# Patient Record
Sex: Male | Born: 1945 | Race: White | Hispanic: No | Marital: Married | Smoking: Never smoker
Health system: Southern US, Community
[De-identification: ages and names within clinical notes are randomized; demographics above are authoritative.]

## PROBLEM LIST (undated history)

## (undated) DIAGNOSIS — R0989 Other specified symptoms and signs involving the circulatory and respiratory systems: Secondary | ICD-10-CM

## (undated) DIAGNOSIS — E559 Vitamin D deficiency, unspecified: Secondary | ICD-10-CM

## (undated) DIAGNOSIS — E039 Hypothyroidism, unspecified: Secondary | ICD-10-CM

## (undated) DIAGNOSIS — R7303 Prediabetes: Secondary | ICD-10-CM

## (undated) DIAGNOSIS — E785 Hyperlipidemia, unspecified: Secondary | ICD-10-CM

## (undated) HISTORY — DX: Hypothyroidism, unspecified: E03.9

## (undated) HISTORY — PX: OTHER SURGICAL HISTORY: SHX169

## (undated) HISTORY — DX: Other specified symptoms and signs involving the circulatory and respiratory systems: R09.89

## (undated) HISTORY — DX: Hyperlipidemia, unspecified: E78.5

## (undated) HISTORY — PX: LUMBAR DISC SURGERY: SHX700

## (undated) HISTORY — DX: Vitamin D deficiency, unspecified: E55.9

## (undated) HISTORY — DX: Prediabetes: R73.03

---

## 1989-11-21 HISTORY — PX: ORTHOPEDIC SURGERY: SHX850

## 1999-12-27 ENCOUNTER — Ambulatory Visit (HOSPITAL_COMMUNITY): Admission: RE | Admit: 1999-12-27 | Discharge: 1999-12-27 | Payer: Self-pay | Admitting: Internal Medicine

## 1999-12-27 ENCOUNTER — Encounter: Payer: Self-pay | Admitting: Internal Medicine

## 2004-07-23 ENCOUNTER — Encounter: Payer: Self-pay | Admitting: Gastroenterology

## 2004-07-23 DIAGNOSIS — D126 Benign neoplasm of colon, unspecified: Secondary | ICD-10-CM | POA: Insufficient documentation

## 2008-11-21 HISTORY — PX: POLYPECTOMY: SHX149

## 2008-11-21 HISTORY — PX: COLONOSCOPY: SHX174

## 2008-12-04 ENCOUNTER — Encounter: Payer: Self-pay | Admitting: Gastroenterology

## 2008-12-29 ENCOUNTER — Ambulatory Visit: Payer: Self-pay | Admitting: Gastroenterology

## 2008-12-29 LAB — CONVERTED CEMR LAB
Basophils Absolute: 0 10*3/uL (ref 0.0–0.1)
Basophils Relative: 0.3 % (ref 0.0–3.0)
Eosinophils Absolute: 0.1 10*3/uL (ref 0.0–0.7)
Eosinophils Relative: 1.1 % (ref 0.0–5.0)
HCT: 39.8 % (ref 39.0–52.0)
Hemoglobin: 13.5 g/dL (ref 13.0–17.0)
Lymphocytes Relative: 23.7 % (ref 12.0–46.0)
MCHC: 34.1 g/dL (ref 30.0–36.0)
MCV: 88.1 fL (ref 78.0–100.0)
Monocytes Absolute: 0.7 10*3/uL (ref 0.1–1.0)
Monocytes Relative: 10.5 % (ref 3.0–12.0)
Neutro Abs: 4.5 10*3/uL (ref 1.4–7.7)
Neutrophils Relative %: 64.4 % (ref 43.0–77.0)
Platelets: 215 10*3/uL (ref 150–400)
RBC: 4.51 M/uL (ref 4.22–5.81)
RDW: 12.6 % (ref 11.5–14.6)
WBC: 7 10*3/uL (ref 4.5–10.5)

## 2009-01-01 ENCOUNTER — Encounter: Payer: Self-pay | Admitting: Gastroenterology

## 2009-01-28 ENCOUNTER — Ambulatory Visit: Payer: Self-pay | Admitting: Gastroenterology

## 2009-02-06 ENCOUNTER — Ambulatory Visit: Payer: Self-pay | Admitting: Gastroenterology

## 2009-02-06 ENCOUNTER — Encounter: Payer: Self-pay | Admitting: Gastroenterology

## 2009-02-10 ENCOUNTER — Encounter: Payer: Self-pay | Admitting: Gastroenterology

## 2009-12-04 ENCOUNTER — Encounter: Payer: Self-pay | Admitting: Gastroenterology

## 2010-04-21 ENCOUNTER — Ambulatory Visit (HOSPITAL_COMMUNITY): Admission: RE | Admit: 2010-04-21 | Discharge: 2010-04-21 | Payer: Self-pay | Admitting: Surgery

## 2011-02-07 LAB — CBC
HCT: 41 % (ref 39.0–52.0)
Hemoglobin: 13.5 g/dL (ref 13.0–17.0)
MCHC: 32.9 g/dL (ref 30.0–36.0)
MCV: 88.2 fL (ref 78.0–100.0)
Platelets: 228 10*3/uL (ref 150–400)
RBC: 4.66 MIL/uL (ref 4.22–5.81)
RDW: 14.4 % (ref 11.5–15.5)
WBC: 6.1 10*3/uL (ref 4.0–10.5)

## 2011-02-07 LAB — BASIC METABOLIC PANEL
BUN: 20 mg/dL (ref 6–23)
CO2: 28 mEq/L (ref 19–32)
Calcium: 9.7 mg/dL (ref 8.4–10.5)
Chloride: 105 mEq/L (ref 96–112)
Creatinine, Ser: 1.62 mg/dL — ABNORMAL HIGH (ref 0.4–1.5)
GFR calc Af Amer: 52 mL/min — ABNORMAL LOW (ref >60)
GFR calc non Af Amer: 43 mL/min — ABNORMAL LOW (ref >60)
Glucose, Bld: 98 mg/dL (ref 70–99)
Potassium: 4.3 mEq/L (ref 3.5–5.1)
Sodium: 141 mEq/L (ref 135–145)

## 2011-02-07 LAB — DIFFERENTIAL
Basophils Absolute: 0 10*3/uL (ref 0.0–0.1)
Basophils Relative: 1 % (ref 0–1)
Neutro Abs: 3.8 10*3/uL (ref 1.7–7.7)
Neutrophils Relative %: 62 % (ref 43–77)

## 2011-03-28 ENCOUNTER — Ambulatory Visit (HOSPITAL_COMMUNITY): Payer: 59

## 2011-03-28 ENCOUNTER — Ambulatory Visit (HOSPITAL_COMMUNITY)
Admission: RE | Admit: 2011-03-28 | Discharge: 2011-03-28 | Disposition: A | Discharge status: Home / Self Care | Payer: 59 | Source: Ambulatory Visit | Attending: Urology | Admitting: Urology

## 2011-03-28 DIAGNOSIS — N200 Calculus of kidney: Secondary | ICD-10-CM | POA: Insufficient documentation

## 2011-03-28 DIAGNOSIS — R310 Gross hematuria: Secondary | ICD-10-CM | POA: Insufficient documentation

## 2011-09-25 ENCOUNTER — Other Ambulatory Visit (INDEPENDENT_AMBULATORY_CARE_PROVIDER_SITE_OTHER): Payer: Self-pay | Admitting: Surgery

## 2013-11-06 ENCOUNTER — Encounter: Payer: Self-pay | Admitting: Internal Medicine

## 2013-11-06 DIAGNOSIS — E039 Hypothyroidism, unspecified: Secondary | ICD-10-CM | POA: Insufficient documentation

## 2013-11-06 DIAGNOSIS — R0989 Other specified symptoms and signs involving the circulatory and respiratory systems: Secondary | ICD-10-CM | POA: Insufficient documentation

## 2013-11-06 DIAGNOSIS — R7303 Prediabetes: Secondary | ICD-10-CM | POA: Insufficient documentation

## 2013-11-06 DIAGNOSIS — E785 Hyperlipidemia, unspecified: Secondary | ICD-10-CM | POA: Insufficient documentation

## 2013-11-07 ENCOUNTER — Ambulatory Visit (INDEPENDENT_AMBULATORY_CARE_PROVIDER_SITE_OTHER): Payer: 59 | Admitting: Emergency Medicine

## 2013-11-07 VITALS — BP 122/74 | HR 60 | Temp 98.1°F | Resp 16 | Wt 185.2 lb

## 2013-11-07 DIAGNOSIS — Z23 Encounter for immunization: Secondary | ICD-10-CM

## 2013-11-07 DIAGNOSIS — I10 Essential (primary) hypertension: Secondary | ICD-10-CM

## 2013-11-07 DIAGNOSIS — R5381 Other malaise: Secondary | ICD-10-CM

## 2013-11-07 DIAGNOSIS — E782 Mixed hyperlipidemia: Secondary | ICD-10-CM

## 2013-11-07 DIAGNOSIS — M549 Dorsalgia, unspecified: Secondary | ICD-10-CM

## 2013-11-07 NOTE — Patient Instructions (Signed)
Back Pain, Adult  Back pain is very common. The pain often gets better over time. The cause of back pain is usually not dangerous. Most people can learn to manage their back pain on their own.   HOME CARE   · Stay active. Start with short walks on flat ground if you can. Try to walk farther each day.  · Do not sit, drive, or stand in one place for more than 30 minutes. Do not stay in bed.  · Do not avoid exercise or work. Activity can help your back heal faster.  · Be careful when you bend or lift an object. Bend at your knees, keep the object close to you, and do not twist.  · Sleep on a firm mattress. Lie on your side, and bend your knees. If you lie on your back, put a pillow under your knees.  · Only take medicines as told by your doctor.  · Put ice on the injured area.  · Put ice in a plastic bag.  · Place a towel between your skin and the bag.  · Leave the ice on for 15-20 minutes, 03-04 times a day for the first 2 to 3 days. After that, you can switch between ice and heat packs.  · Ask your doctor about back exercises or massage.  · Avoid feeling anxious or stressed. Find good ways to deal with stress, such as exercise.  GET HELP RIGHT AWAY IF:   · Your pain does not go away with rest or medicine.  · Your pain does not go away in 1 week.  · You have new problems.  · You do not feel well.  · The pain spreads into your legs.  · You cannot control when you poop (bowel movement) or pee (urinate).  · Your arms or legs feel weak or lose feeling (numbness).  · You feel sick to your stomach (nauseous) or throw up (vomit).  · You have belly (abdominal) pain.  · You feel like you may pass out (faint).  MAKE SURE YOU:   · Understand these instructions.  · Will watch your condition.  · Will get help right away if you are not doing well or get worse.  Document Released: 04/25/2008 Document Revised: 01/30/2012 Document Reviewed: 03/28/2011  ExitCare® Patient Information ©2014 [employer].

## 2013-11-10 ENCOUNTER — Encounter: Payer: Self-pay | Admitting: Emergency Medicine

## 2013-11-10 NOTE — Progress Notes (Signed)
   Subjective:    Patient ID: Tyler Gilbert, male    DOB: 04/02/1946, 67 y.o.   MRN: [id number]  HPI Comments: 67 yo male presents for f/u with back pain. He notes he has decreased lifting and modified exercise routine without any change with pain. He notes pain is on/ off and denies any triggers. He notes the pain is midline but occasional radiates to the lateral sides.   Current Outpatient Prescriptions on File Prior to Visit  Medication Sig Dispense Refill  . aspirin 325 MG tablet Take 325 mg by mouth every other day. Taking 1/2      . Cholecalciferol (VITAMIN D PO) Take 6,000 Int'l Units by mouth daily.      . fenofibrate micronized (LOFIBRA) 134 MG capsule Take 134 mg by mouth daily before breakfast.      . levothyroxine (SYNTHROID, LEVOTHROID) 100 MCG tablet Take 100 mcg by mouth daily before breakfast. 1/2 tablet      . MAGNESIUM PO Take 1,000 mg by mouth daily.      . Multiple Vitamins-Minerals (MULTIVITAMIN PO) Take by mouth daily.      . Omega-3 Fatty Acids (FISH OIL PO) Take by mouth daily.      . rosuvastatin (CRESTOR) 20 MG tablet Take 20 mg by mouth daily.       No current facility-administered medications on file prior to visit.   ALLERGIES Prednisolone  Past Medical History  Diagnosis Date  . Labile hypertension   . Hyperlipidemia   . Prediabetes   . Hypothyroid   . Vitamin D deficiency     Review of Systems  Musculoskeletal: Positive for back pain.  All other systems reviewed and are negative.   BP 122/74  Pulse 60  Temp(Src) 98.1 F (36.7 C)  Resp 16  Wt 185 lb 3.2 oz (84.006 kg)     Objective:   Physical Exam  Nursing note and vitals reviewed. Constitutional: He is oriented to person, place, and time. He appears well-developed and well-nourished.  Cardiovascular: Normal rate, regular rhythm, normal heart sounds and intact distal pulses.   Pulmonary/Chest: Effort normal and breath sounds normal.  Musculoskeletal: Normal range of motion. He  exhibits no tenderness.  No obvious tenderness  Neurological: He is alert and oriented to person, place, and time. He has normal reflexes. No cranial nerve deficit. Coordination normal.  Skin: Skin is warm and dry.  Psychiatric: He has a normal mood and affect. Judgment normal.          Assessment & Plan:  Back pain- get XrAy evaluation, heat stretch, ice, explained.

## 2013-11-12 ENCOUNTER — Ambulatory Visit (INDEPENDENT_AMBULATORY_CARE_PROVIDER_SITE_OTHER): Payer: 59 | Admitting: Emergency Medicine

## 2013-11-12 ENCOUNTER — Encounter: Payer: Self-pay | Admitting: Emergency Medicine

## 2013-11-12 VITALS — BP 118/62 | HR 58 | Temp 98.2°F | Resp 18 | Wt 180.0 lb

## 2013-11-12 DIAGNOSIS — J329 Chronic sinusitis, unspecified: Secondary | ICD-10-CM

## 2013-11-12 DIAGNOSIS — R05 Cough: Secondary | ICD-10-CM

## 2013-11-12 DIAGNOSIS — J309 Allergic rhinitis, unspecified: Secondary | ICD-10-CM

## 2013-11-12 MED ORDER — AZITHROMYCIN 250 MG PO TABS: ORAL_TABLET | ORAL | Status: AC

## 2013-11-12 MED ORDER — PREDNISONE 10 MG PO TABS: ORAL_TABLET | ORAL | Status: DC

## 2013-11-12 MED ORDER — BENZONATATE 100 MG PO CAPS: 100.0000 mg | ORAL_CAPSULE | Freq: Three times a day (TID) | ORAL | Status: DC | PRN

## 2013-11-12 NOTE — Progress Notes (Signed)
   Subjective:    Patient ID: Tyler Gilbert, male    DOB: 1946/04/24, 67 y.o.   MRN: [id number]  HPI Comments: 67 yo male presents with no relief with cold symptoms with NYquil x 2days. 3 days of increasing itchy throat, sinus pressure, dry cough and now feels more feverish and that symptoms are progressing to chest.   Sinusitis Associated symptoms include congestion, coughing, sinus pressure, sneezing and a sore throat.   Current Outpatient Prescriptions on File Prior to Visit  Medication Sig Dispense Refill  . aspirin 325 MG tablet Take 325 mg by mouth every other day. Taking 1/2      . Cholecalciferol (VITAMIN D PO) Take 6,000 Int'l Units by mouth daily.      . fenofibrate micronized (LOFIBRA) 134 MG capsule Take 134 mg by mouth daily before breakfast.      . levothyroxine (SYNTHROID, LEVOTHROID) 100 MCG tablet Take 100 mcg by mouth daily before breakfast. 1/2 tablet      . MAGNESIUM PO Take 1,000 mg by mouth daily.      . Multiple Vitamins-Minerals (MULTIVITAMIN PO) Take by mouth daily.      . Omega-3 Fatty Acids (FISH OIL PO) Take by mouth daily.      . rosuvastatin (CRESTOR) 20 MG tablet Take 20 mg by mouth daily.       No current facility-administered medications on file prior to visit.   ALLERGIES Prednisolone  Past Medical History  Diagnosis Date  . Labile hypertension   . Hyperlipidemia   . Prediabetes   . Hypothyroid   . Vitamin D deficiency       Review of Systems  Constitutional: Positive for fever and fatigue.  HENT: Positive for congestion, postnasal drip, sinus pressure, sneezing and sore throat.   Respiratory: Positive for cough.   Gastrointestinal: Positive for abdominal pain.   BP 118/62  Pulse 58  Temp(Src) 98.2 F (36.8 C) (Temporal)  Resp 18  Wt 180 lb (81.647 kg)     Objective:   Physical Exam  Nursing note and vitals reviewed. Constitutional: He is oriented to person, place, and time. He appears well-developed and well-nourished.   HENT:  Head: Normocephalic and atraumatic.  Right Ear: External ear normal.  Left Ear: External ear normal.  Nose: Nose normal.  Mouth/Throat: Oropharynx is clear and moist. No oropharyngeal exudate.  TMs yellow bilateral with frontal/ maxillary tenderness  Eyes: Conjunctivae are normal.  Neck: Normal range of motion.  Cardiovascular: Normal rate, regular rhythm, normal heart sounds and intact distal pulses.   Pulmonary/Chest: Effort normal and breath sounds normal.  Dry hacky cough  Abdominal: Soft.  Musculoskeletal: Normal range of motion.  Lymphadenopathy:    He has no cervical adenopathy.  Neurological: He is alert and oriented to person, place, and time.  Skin: Skin is warm and dry.  Psychiatric: He has a normal mood and affect. Judgment normal.          Assessment & Plan:  Sinusitis/Cough/  Allergic rhinitis- Allegra OTC, increase H2o, allergy hygiene explained. ZPak/ Pred 10 mg DP, Tessalon Perles 100mg  all AD.

## 2013-11-12 NOTE — Patient Instructions (Signed)
Sinusitis Sinusitis is redness, soreness, and puffiness (inflammation) of the air pockets in the bones of your face (sinuses). The redness, soreness, and puffiness can cause air and mucus to get trapped in your sinuses. This can allow germs to grow and cause an infection.  HOME CARE   Drink enough fluids to keep your pee (urine) clear or pale yellow.  Use a humidifier in your home.  Run a hot shower to create steam in the bathroom. Sit in the bathroom with the door closed. Breathe in the steam 3 4 times a day.  Put a warm, moist washcloth on your face 3 4 times a day, or as told by your doctor.  Use salt water sprays (saline sprays) to wet the thick fluid in your nose. This can help the sinuses drain.  Only take medicine as told by your doctor. GET HELP RIGHT AWAY IF:   Your pain gets worse.  You have very bad headaches.  You are sick to your stomach (nauseous).  You throw up (vomit).  You are very sleepy (drowsy) all the time.  Your face is puffy (swollen).  Your vision changes.  You have a stiff neck.  You have trouble breathing. MAKE SURE YOU:   Understand these instructions.  Will watch your condition.  Will get help right away if you are not doing well or get worse. Document Released: 04/25/2008 Document Revised: 08/01/2012 Document Reviewed: 06/12/2012 Exit[employer] Patient Information 2014 [employer]. Allergic Rhinitis Allergic rhinitis is when the mucous membranes in the nose respond to allergens. Allergens are particles in the air that cause your body to have an allergic reaction. This causes you to release allergic antibodies. Through a chain of events, these eventually cause you to release histamine into the blood stream (hence the use of antihistamines). Although meant to be protective to the body, it is this release that causes your discomfort, such as frequent sneezing, congestion and an itchy runny nose.  CAUSES  The pollen allergens may come from grasses,  trees, and weeds. This is seasonal allergic rhinitis, or "hay fever." Other allergens cause year-round allergic rhinitis (perennial allergic rhinitis) such as house dust mite allergen, pet dander and mold spores.  SYMPTOMS   Nasal stuffiness (congestion).  Runny, itchy nose with sneezing and tearing of the eyes.  There is often an itching of the mouth, eyes and ears. It cannot be cured, but it can be controlled with medications. DIAGNOSIS  If you are unable to determine the offending allergen, skin or blood testing may find it. TREATMENT   Avoid the allergen.  Medications and allergy shots (immunotherapy) can help.  Hay fever may often be treated with antihistamines in pill or nasal spray forms. Antihistamines block the effects of histamine. There are over-the-counter medicines that may help with nasal congestion and swelling around the eyes. Check with your caregiver before taking or giving this medicine. If the treatment above does not work, there are many new medications your caregiver can prescribe. Stronger medications may be used if initial measures are ineffective. Desensitizing injections can be used if medications and avoidance fails. Desensitization is when a patient is given ongoing shots until the body becomes less sensitive to the allergen. Make sure you follow up with your caregiver if problems continue. SEEK MEDICAL CARE IF:   You develop fever (more than 100.5 F (38.1 C).  You develop a cough that does not stop easily (persistent).  You have shortness of breath.  You start wheezing.  Symptoms interfere with   normal daily activities. Document Released: 08/02/2001 Document Revised: 01/30/2012 Document Reviewed: 02/11/2009 Exit[employer] Patient Information 2014 [employer].  

## 2013-11-19 ENCOUNTER — Ambulatory Visit
Admission: RE | Admit: 2013-11-19 | Discharge: 2013-11-19 | Disposition: A | Discharge status: Home / Self Care | Payer: 59 | Source: Ambulatory Visit | Attending: Emergency Medicine | Admitting: Emergency Medicine

## 2013-11-19 DIAGNOSIS — M549 Dorsalgia, unspecified: Secondary | ICD-10-CM

## 2013-11-22 ENCOUNTER — Telehealth: Payer: Self-pay | Admitting: *Deleted

## 2013-11-22 DIAGNOSIS — M549 Dorsalgia, unspecified: Secondary | ICD-10-CM

## 2013-11-22 NOTE — Telephone Encounter (Signed)
Spoke with patient's wife, Butch Penny, about Xray results and she is requesting Ortho referral.  Referral sent to Dr. Berenice Primas' office for further evaluation and treatment.

## 2013-12-17 ENCOUNTER — Encounter: Payer: Self-pay | Admitting: Gastroenterology

## 2014-01-31 ENCOUNTER — Other Ambulatory Visit: Payer: Self-pay | Admitting: Physician Assistant

## 2014-01-31 MED ORDER — FENOFIBRATE 160 MG PO TABS: 160.0000 mg | ORAL_TABLET | Freq: Every day | ORAL | Status: DC

## 2014-02-06 ENCOUNTER — Ambulatory Visit (INDEPENDENT_AMBULATORY_CARE_PROVIDER_SITE_OTHER): Payer: 59 | Admitting: Internal Medicine

## 2014-02-06 ENCOUNTER — Encounter: Payer: Self-pay | Admitting: Internal Medicine

## 2014-02-06 ENCOUNTER — Ambulatory Visit: Payer: Self-pay | Admitting: Emergency Medicine

## 2014-02-06 VITALS — BP 118/78 | HR 56 | Temp 98.6°F | Resp 16 | Ht 71.5 in | Wt 182.4 lb

## 2014-02-06 DIAGNOSIS — I10 Essential (primary) hypertension: Secondary | ICD-10-CM

## 2014-02-06 DIAGNOSIS — R0989 Other specified symptoms and signs involving the circulatory and respiratory systems: Secondary | ICD-10-CM

## 2014-02-06 DIAGNOSIS — Z125 Encounter for screening for malignant neoplasm of prostate: Secondary | ICD-10-CM

## 2014-02-06 DIAGNOSIS — Z79899 Other long term (current) drug therapy: Secondary | ICD-10-CM | POA: Insufficient documentation

## 2014-02-06 DIAGNOSIS — Z789 Other specified health status: Secondary | ICD-10-CM

## 2014-02-06 DIAGNOSIS — E782 Mixed hyperlipidemia: Secondary | ICD-10-CM

## 2014-02-06 DIAGNOSIS — E559 Vitamin D deficiency, unspecified: Secondary | ICD-10-CM | POA: Insufficient documentation

## 2014-02-06 DIAGNOSIS — Z111 Encounter for screening for respiratory tuberculosis: Secondary | ICD-10-CM

## 2014-02-06 DIAGNOSIS — Z1212 Encounter for screening for malignant neoplasm of rectum: Secondary | ICD-10-CM

## 2014-02-06 DIAGNOSIS — Z1331 Encounter for screening for depression: Secondary | ICD-10-CM

## 2014-02-06 DIAGNOSIS — Z0000 Encounter for general adult medical examination without abnormal findings: Secondary | ICD-10-CM

## 2014-02-06 LAB — CBC WITH DIFFERENTIAL/PLATELET
BASOS ABS: 0.1 10*3/uL (ref 0.0–0.1)
BASOS PCT: 1 % (ref 0–1)
Eosinophils Absolute: 0.1 10*3/uL (ref 0.0–0.7)
Eosinophils Relative: 2 % (ref 0–5)
HEMATOCRIT: 41.8 % (ref 39.0–52.0)
HEMOGLOBIN: 14.4 g/dL (ref 13.0–17.0)
LYMPHS PCT: 24 % (ref 12–46)
Lymphs Abs: 1.5 10*3/uL (ref 0.7–4.0)
MCH: 29.9 pg (ref 26.0–34.0)
MCHC: 34.4 g/dL (ref 30.0–36.0)
MCV: 86.7 fL (ref 78.0–100.0)
MONOS PCT: 9 % (ref 3–12)
Monocytes Absolute: 0.6 10*3/uL (ref 0.1–1.0)
NEUTROS ABS: 4.1 10*3/uL (ref 1.7–7.7)
NEUTROS PCT: 64 % (ref 43–77)
Platelets: 254 10*3/uL (ref 150–400)
RBC: 4.82 MIL/uL (ref 4.22–5.81)
RDW: 13.6 % (ref 11.5–15.5)
WBC: 6.4 10*3/uL (ref 4.0–10.5)

## 2014-02-06 LAB — HEMOGLOBIN A1C
Hgb A1c MFr Bld: 5.9 % — ABNORMAL HIGH (ref <5.7)
MEAN PLASMA GLUCOSE: 123 mg/dL — AB (ref <117)

## 2014-02-06 MED ORDER — PREDNISONE 10 MG PO TABS: ORAL_TABLET | ORAL | Status: DC

## 2014-02-06 MED ORDER — CYCLOBENZAPRINE HCL 10 MG PO TABS: ORAL_TABLET | ORAL | Status: DC

## 2014-02-06 MED ORDER — FENOFIBRATE 160 MG PO TABS: 160.0000 mg | ORAL_TABLET | Freq: Every day | ORAL | Status: DC

## 2014-02-06 NOTE — Patient Instructions (Signed)

## 2014-02-06 NOTE — Progress Notes (Signed)
Patient ID: Tyler Gilbert, male   DOB: 04/07/1946, 68 y.o.   MRN: [id number]   Annual Screening Comprehensive Examination  This very nice 68 y.o.  MWM presents for complete physical.  Patient has been followed for Labile HTN, Diabetes  Prediabetes, Hyperlipidemia, and Vitamin D Deficiency.   Labile HTN predates since 2008 and has been monitored expectantly. Patient's BP has been controlled at home.Today's BP: 118/78 mmHg. Patient denies any cardiac symptoms as chest pain, palpitations, shortness of breath, dizziness or ankle swelling.   Patient's hyperlipidemia is controlled with diet and meds. Patient denies myalgias or other medication SE's. Last cholesterol last visit was 167, triglycerides 154, HDL 38 and LDL 98 in Oct 2014 - at goal.     Patient has prediabetes with A1c 5.8% in 2011 and with last A1c 6.1 % in Oct 2014. Patient denies reactive hypoglycemic symptoms, visual blurring, diabetic polys, or paresthesias.    Finally, patient has history of Vitamin D Deficiency of 30 in 2008 with last vitamin D 75 in Oct 2014.  Medication Sig  . aspirin 325 MG tablet Take 325 mg by mouth every other day. Taking 1/2  . Cholecalciferol (VITAMIN D PO) Take 6,000 Int'l Units by mouth daily.  . fenofibrate 160 MG tablet Take 1 tablet (160 mg total) by mouth daily.  Marland Kitchen levothyroxine (SYNTHROID, LEVOTHROID) 100 MCG tablet Take 100 mcg by mouth daily before breakfast. 1/2 tablet  . MAGNESIUM PO Take 1,000 mg by mouth daily.  . Multiple Vitamins-Minerals (MULTIVITAMIN PO) Take by mouth daily.  . Omega-3 Fatty Acids (FISH OIL PO) Take by mouth daily.    Allergies  Allergen Reactions  . Prednisolone     High doses make her "nervous"    Past Medical History  Diagnosis Date  . Labile hypertension   . Hyperlipidemia   . Prediabetes   . Hypothyroid   . Vitamin D deficiency     Past Surgical History  Procedure Laterality Date  . Orthopedic surgery Left 1991    left hip in [place]    Family  History  Problem Relation Age of Onset  . Diverticulosis Mother   . Cancer Mother   . Lymphoma Mother   . Heart disease Father   . Heart attack Father   . Colitis Brother     History   Social History  . Marital Status: Married    Spouse Name: N/A    Number of Children:  1 daughter  . Years of Education: N/A   Occupational History  .  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 Smoking status: Never Smoker   . Smokeless tobacco: Never Used  . Alcohol Use: No  . Drug Use: No  . Sexual Activity: Active     ROS Constitutional: Denies fever, chills, weight loss/gain, headaches, insomnia, fatigue, night sweats, and change in appetite. Eyes: Denies redness, blurred vision, diplopia, discharge, itchy, watery eyes.  ENT: Denies discharge, congestion, post nasal drip, epistaxis, sore throat, earache, hearing loss, dental pain, Tinnitus, Vertigo, Sinus pain, snoring.  Cardio: Denies chest pain, palpitations, irregular heartbeat, syncope, dyspnea, diaphoresis, orthopnea, PND, claudication, edema Respiratory: denies cough, dyspnea, DOE, pleurisy, hoarseness, laryngitis, wheezing.  Gastrointestinal: Denies dysphagia, heartburn, reflux, water brash, pain, cramps, nausea, vomiting, bloating, diarrhea, constipation, hematemesis, melena, hematochezia, jaundice, hemorrhoids Genitourinary: Denies dysuria, frequency, urgency, nocturia, hesitancy, discharge, hematuria, flank pain Musculoskeletal: Denies arthralgia, myalgia, stiffness, Jt. Swelling, pain, limp, and strain/sprain. Skin: Denies puritis, rash, hives, warts, acne, eczema, changing  in skin lesion Neuro: No weakness, tremor, incoordination, spasms, paresthesia, pain Psychiatric: Denies confusion, memory loss, sensory loss Endocrine: Denies change in weight, skin, hair change, nocturia, and paresthesia, diabetic polys, visual blurring, hyper / hypo glycemic episodes.  Heme/Lymph: No excessive bleeding, bruising, or elarged  lymph nodes.  Physical Exam  BP 118/78  Pulse 56  Temp(Src) 98.6 F (37 C) (Temporal)  Resp 16  Ht 5' 11.5" (1.816 m)  Wt 182 lb 6.4 oz (82.736 kg)  BMI 25.09 kg/m2  General Appearance: Well nourished, in no apparent distress. Eyes: PERRLA, EOMs, conjunctiva no swelling or erythema, normal fundi and vessels. Sinuses: No frontal/maxillary tenderness ENT/Mouth: EACs patent / TMs  nl. Nares clear without erythema, swelling, mucoid exudates. Oral hygiene is good. No erythema, swelling, or exudate. Tongue normal, non-obstructing. Tonsils not swollen or erythematous. Hearing normal.  Neck: Supple, thyroid normal. No bruits, nodes or JVD. Respiratory: Respiratory effort normal.  BS equal and clear bilateral without rales, rhonci, wheezing or stridor. Cardio: Heart sounds are normal with regular rate and rhythm and no murmurs, rubs or gallops. Peripheral pulses are normal and equal bilaterally without edema. No aortic or femoral bruits. Chest: symmetric with normal excursions and percussion.  Abdomen: Flat, soft, with bowl sounds. Nontender, no guarding, rebound, hernias, masses, or organomegaly.  Lymphatics: Non tender without lymphadenopathy.  Genitourinary: No hernias.Testes nl. DRE - prostate nl for age - smooth & firm w/o nodules. Musculoskeletal: Full ROM all peripheral extremities, joint stability, 5/5 strength, and normal gait. Skin: Warm and dry without rashes, lesions, cyanosis, clubbing or  ecchymosis.  Neuro: Cranial nerves intact, reflexes equal bilaterally. Normal muscle tone, no cerebellar symptoms. Sensation intact.  Pysch: Awake and oriented X 3, normal affect, insight and judgment appropriate.   Assessment and Plan  1. Annual Screening Examination 2. Hypertension, Labile 3. Hyperlipidemia 4. Pre Diabetes 5. Vitamin D Deficiency  Continue prudent diet as discussed, weight control, BP monitoring, regular exercise, and medications as discussed.  Discussed med effects and  SE's. Routine screening labs and tests as requested with regular follow-up as recommended.

## 2014-02-07 LAB — BASIC METABOLIC PANEL WITH GFR
BUN: 19 mg/dL (ref 6–23)
CHLORIDE: 105 meq/L (ref 96–112)
CO2: 23 mEq/L (ref 19–32)
Calcium: 9.7 mg/dL (ref 8.4–10.5)
Creat: 1.45 mg/dL — ABNORMAL HIGH (ref 0.50–1.35)
GFR, EST AFRICAN AMERICAN: 57 mL/min — AB
GFR, EST NON AFRICAN AMERICAN: 49 mL/min — AB
Glucose, Bld: 120 mg/dL — ABNORMAL HIGH (ref 70–99)
POTASSIUM: 4.1 meq/L (ref 3.5–5.3)
SODIUM: 141 meq/L (ref 135–145)

## 2014-02-07 LAB — INSULIN, FASTING: INSULIN FASTING, SERUM: 63 u[IU]/mL — AB (ref 3–28)

## 2014-02-07 LAB — HEPATIC FUNCTION PANEL
ALT: 21 U/L (ref 0–53)
AST: 28 U/L (ref 0–37)
Albumin: 4.5 g/dL (ref 3.5–5.2)
Alkaline Phosphatase: 45 U/L (ref 39–117)
BILIRUBIN DIRECT: 0.1 mg/dL (ref 0.0–0.3)
BILIRUBIN INDIRECT: 0.4 mg/dL (ref 0.2–1.2)
BILIRUBIN TOTAL: 0.5 mg/dL (ref 0.2–1.2)
Total Protein: 7.1 g/dL (ref 6.0–8.3)

## 2014-02-07 LAB — LIPID PANEL
CHOL/HDL RATIO: 4.1 ratio
CHOLESTEROL: 178 mg/dL (ref 0–200)
HDL: 43 mg/dL (ref >39)
LDL Cholesterol: 108 mg/dL — ABNORMAL HIGH (ref 0–99)
Triglycerides: 137 mg/dL (ref <150)
VLDL: 27 mg/dL (ref 0–40)

## 2014-02-07 LAB — URINALYSIS, MICROSCOPIC ONLY
BACTERIA UA: NONE SEEN
CASTS: NONE SEEN
Crystals: NONE SEEN
Squamous Epithelial / LPF: NONE SEEN

## 2014-02-07 LAB — MICROALBUMIN / CREATININE URINE RATIO
CREATININE, URINE: 141 mg/dL
MICROALB UR: 0.5 mg/dL (ref 0.00–1.89)
Microalb Creat Ratio: 3.5 mg/g (ref 0.0–30.0)

## 2014-02-07 LAB — MAGNESIUM: MAGNESIUM: 2 mg/dL (ref 1.5–2.5)

## 2014-02-07 LAB — VITAMIN B12: Vitamin B-12: 409 pg/mL (ref 211–911)

## 2014-02-07 LAB — PSA: PSA: 6.84 ng/mL — AB (ref <=4.00)

## 2014-02-07 LAB — VITAMIN D 25 HYDROXY (VIT D DEFICIENCY, FRACTURES): VIT D 25 HYDROXY: 74 ng/mL (ref 30–89)

## 2014-02-07 LAB — TSH: TSH: 3.171 u[IU]/mL (ref 0.350–4.500)

## 2014-02-10 LAB — TB SKIN TEST
Induration: 0 mm
TB Skin Test: NEGATIVE

## 2014-02-15 ENCOUNTER — Other Ambulatory Visit: Payer: Self-pay | Admitting: Internal Medicine

## 2014-05-09 ENCOUNTER — Ambulatory Visit: Payer: Self-pay | Admitting: Emergency Medicine

## 2014-05-15 ENCOUNTER — Ambulatory Visit: Payer: Self-pay | Admitting: Physician Assistant

## 2014-06-12 ENCOUNTER — Encounter: Payer: Self-pay | Admitting: Physician Assistant

## 2014-06-12 ENCOUNTER — Ambulatory Visit (INDEPENDENT_AMBULATORY_CARE_PROVIDER_SITE_OTHER): Payer: 59 | Admitting: Physician Assistant

## 2014-06-12 VITALS — BP 102/68 | HR 60 | Temp 98.1°F | Resp 16 | Wt 183.0 lb

## 2014-06-12 DIAGNOSIS — E039 Hypothyroidism, unspecified: Secondary | ICD-10-CM

## 2014-06-12 DIAGNOSIS — Z79899 Other long term (current) drug therapy: Secondary | ICD-10-CM

## 2014-06-12 DIAGNOSIS — R7303 Prediabetes: Secondary | ICD-10-CM

## 2014-06-12 DIAGNOSIS — R0989 Other specified symptoms and signs involving the circulatory and respiratory systems: Secondary | ICD-10-CM

## 2014-06-12 DIAGNOSIS — E785 Hyperlipidemia, unspecified: Secondary | ICD-10-CM

## 2014-06-12 DIAGNOSIS — I10 Essential (primary) hypertension: Secondary | ICD-10-CM

## 2014-06-12 DIAGNOSIS — E559 Vitamin D deficiency, unspecified: Secondary | ICD-10-CM

## 2014-06-12 DIAGNOSIS — R7309 Other abnormal glucose: Secondary | ICD-10-CM

## 2014-06-12 LAB — CBC WITH DIFFERENTIAL/PLATELET
Basophils Absolute: 0.1 10*3/uL (ref 0.0–0.1)
Basophils Relative: 1 % (ref 0–1)
EOS ABS: 0.2 10*3/uL (ref 0.0–0.7)
EOS PCT: 3 % (ref 0–5)
HCT: 42.2 % (ref 39.0–52.0)
HEMOGLOBIN: 14.6 g/dL (ref 13.0–17.0)
LYMPHS ABS: 1.5 10*3/uL (ref 0.7–4.0)
LYMPHS PCT: 26 % (ref 12–46)
MCH: 29.9 pg (ref 26.0–34.0)
MCHC: 34.6 g/dL (ref 30.0–36.0)
MCV: 86.5 fL (ref 78.0–100.0)
Monocytes Absolute: 0.6 10*3/uL (ref 0.1–1.0)
Monocytes Relative: 10 % (ref 3–12)
Neutro Abs: 3.4 10*3/uL (ref 1.7–7.7)
Neutrophils Relative %: 60 % (ref 43–77)
Platelets: 241 10*3/uL (ref 150–400)
RBC: 4.88 MIL/uL (ref 4.22–5.81)
RDW: 13.5 % (ref 11.5–15.5)
WBC: 5.7 10*3/uL (ref 4.0–10.5)

## 2014-06-12 LAB — LIPID PANEL
Cholesterol: 151 mg/dL (ref 0–200)
HDL: 44 mg/dL (ref >39)
LDL Cholesterol: 82 mg/dL (ref 0–99)
TRIGLYCERIDES: 124 mg/dL (ref <150)
Total CHOL/HDL Ratio: 3.4 Ratio
VLDL: 25 mg/dL (ref 0–40)

## 2014-06-12 LAB — MAGNESIUM: Magnesium: 2 mg/dL (ref 1.5–2.5)

## 2014-06-12 LAB — BASIC METABOLIC PANEL WITH GFR
BUN: 18 mg/dL (ref 6–23)
CHLORIDE: 105 meq/L (ref 96–112)
CO2: 25 meq/L (ref 19–32)
CREATININE: 1.51 mg/dL — AB (ref 0.50–1.35)
Calcium: 10.1 mg/dL (ref 8.4–10.5)
GFR, Est African American: 54 mL/min — ABNORMAL LOW
GFR, Est Non African American: 47 mL/min — ABNORMAL LOW
Glucose, Bld: 111 mg/dL — ABNORMAL HIGH (ref 70–99)
Potassium: 4 mEq/L (ref 3.5–5.3)
SODIUM: 140 meq/L (ref 135–145)

## 2014-06-12 LAB — TSH: TSH: 2.47 u[IU]/mL (ref 0.350–4.500)

## 2014-06-12 LAB — HEPATIC FUNCTION PANEL
ALK PHOS: 46 U/L (ref 39–117)
ALT: 24 U/L (ref 0–53)
AST: 26 U/L (ref 0–37)
Albumin: 4.5 g/dL (ref 3.5–5.2)
BILIRUBIN DIRECT: 0.1 mg/dL (ref 0.0–0.3)
BILIRUBIN TOTAL: 0.4 mg/dL (ref 0.2–1.2)
Indirect Bilirubin: 0.3 mg/dL (ref 0.2–1.2)
Total Protein: 7.3 g/dL (ref 6.0–8.3)

## 2014-06-12 LAB — HEMOGLOBIN A1C
Hgb A1c MFr Bld: 6 % — ABNORMAL HIGH (ref <5.7)
Mean Plasma Glucose: 126 mg/dL — ABNORMAL HIGH (ref <117)

## 2014-06-12 NOTE — Progress Notes (Signed)
Assessment and Plan:  Hypertension: Continue medication, monitor blood pressure at home. Continue DASH diet. Cholesterol: Continue diet and exercise. Check cholesterol.  Pre-diabetes-Continue diet and exercise. Check A1C Vitamin D Def- check level and continue medications.  Hypothyroidism-check TSH level, continue medications the same.  Prevnar 13 DUE would like to get in Oct  Continue diet and meds as discussed. Further disposition pending results of labs.  HPI 68 y.o. male  presents for 3 month follow up with hypertension, hyperlipidemia, prediabetes and vitamin D. His blood pressure has been controlled at home, today their BP is BP: 102/68 mmHg He does workout, goes to the gym 5 days a week and walks at the park with his wife. He denies chest pain, shortness of breath, dizziness.  He is on cholesterol medication and denies myalgias. His cholesterol is at goal. The cholesterol last visit was:   Lab Results  Component Value Date   CHOL 178 02/06/2014   HDL 43 02/06/2014   LDLCALC 108* 02/06/2014   TRIG 137 02/06/2014   CHOLHDL 4.1 02/06/2014   He has been working on diet and exercise for prediabetes, and denies nausea, paresthesia of the feet and polydipsia. Last A1C in the office was:  Lab Results  Component Value Date   HGBA1C 5.9* 02/06/2014   Patient is on Vitamin D supplement.   Lab Results  Component Value Date   VD25OH 74 02/06/2014     He is on thyroid medication. His medication was not changed last visit. Patient denies nervousness, palpitations and weight changes.  Lab Results  Component Value Date   TSH 3.171 02/06/2014  .   Current Medications:  Current Outpatient Prescriptions on File Prior to Visit  Medication Sig Dispense Refill  . aspirin 325 MG tablet Take 325 mg by mouth every other day. Taking 1/2      . Cholecalciferol (VITAMIN D PO) Take 6,000 Int'l Units by mouth daily.      . fenofibrate 160 MG tablet Take 1 tablet (160 mg total) by mouth daily.  90 tablet   0  . levothyroxine (SYNTHROID, LEVOTHROID) 100 MCG tablet TAKE 1 TABLET BY MOUTH EVERY DAY  90 tablet  3  . MAGNESIUM PO Take 1,000 mg by mouth daily.      . Multiple Vitamins-Minerals (MULTIVITAMIN PO) Take by mouth daily.      . Omega-3 Fatty Acids (FISH OIL PO) Take by mouth daily.      . rosuvastatin (CRESTOR) 40 MG tablet Take 40 mg by mouth daily.       No current facility-administered medications on file prior to visit.   Medical History:  Past Medical History  Diagnosis Date  . Labile hypertension   . Hyperlipidemia   . Prediabetes   . Hypothyroid   . Vitamin D deficiency    Allergies:  Allergies  Allergen Reactions  . Prednisolone     High doses make her "nervous"     Review of Systems: [X]  = complains of  [ ]  = denies  General: Fatigue [ ]  Fever [ ]  Chills [ ]  Weakness [ ]   Insomnia [ ]  Eyes: Redness [ ]  Blurred vision [ ]  Diplopia [ ]   ENT: Congestion [ ]  Sinus Pain [ ]  Post Nasal Drip [ ]  Sore Throat [ ]  Earache [ ]   Cardiac: Chest pain/pressure [ ]  SOB [ ]  Orthopnea [ ]   Palpitations [ ]   Paroxysmal nocturnal dyspnea[ ]  Claudication [ ]  Edema [ ]   Pulmonary: Cough [ ]  Wheezing[ ]   SOB [ ]   Snoring [ ]   GI: Nausea [ ]  Vomiting[ ]  Dysphagia[ ]  Heartburn[ ]  Abdominal pain [ ]  Constipation [ ] ; Diarrhea [ ] ; BRBPR [ ]  Melena[ ]  GU: Hematuria[ ]  Dysuria [ ]  Nocturia[ ]  Urgency [ ]   Hesitancy [ ]  Discharge [ ]  Neuro: Headaches[ ]  Vertigo[ ]  Paresthesias LEFT FOOT Valu.Nieves ] Spasm [ ]  Speech changes [ ]  Incoordination [ ]   Ortho: Arthritis [ ]  Joint pain [ ]  Muscle pain [ ]  Joint swelling [ ]  Back Pain [ ]  Skin:  Rash [ ]   Pruritis [ ]  Change in skin lesion [ ]   Psych: Depression[ ]  Anxiety[ ]  Confusion [ ]  Memory loss [ ]   Heme/Lypmh: Bleeding [ ]  Bruising [ ]  Enlarged lymph nodes [ ]   Endocrine: Visual blurring [ ]  Paresthesia [ ]  Polyuria [ ]  Polydypsea [ ]    Heat/cold intolerance [ ]  Hypoglycemia [ ]   Family history- Review and unchanged Social history- Review and  unchanged Physical Exam: BP 102/68  Pulse 60  Temp(Src) 98.1 F (36.7 C)  Resp 16  Wt 183 lb (83.008 kg) Wt Readings from Last 3 Encounters:  06/12/14 183 lb (83.008 kg)  02/06/14 182 lb 6.4 oz (82.736 kg)  11/12/13 180 lb (81.647 kg)   General Appearance: Well nourished, in no apparent distress. Eyes: PERRLA, EOMs, conjunctiva no swelling or erythema Sinuses: No Frontal/maxillary tenderness ENT/Mouth: Ext aud canals clear, TMs without erythema, bulging. No erythema, swelling, or exudate on post pharynx.  Tonsils not swollen or erythematous. Hearing normal.  Neck: Supple, thyroid normal.  Respiratory: Respiratory effort normal, BS equal bilaterally without rales, rhonchi, wheezing or stridor.  Cardio: RRR with no MRGs. Brisk peripheral pulses without edema.  Abdomen: Soft, + BS.  Non tender, no guarding, rebound, hernias, masses. Lymphatics: Non tender without lymphadenopathy.  Musculoskeletal: Full ROM, 5/5 strength, normal gait.  Skin: Warm, dry without rashes, lesions, ecchymosis.  Neuro: Cranial nerves intact. Normal muscle tone, no cerebellar symptoms. Sensation intact.  Psych: Awake and oriented X 3, normal affect, Insight and Judgment appropriate.    Vicie Mutters 8:48 AM

## 2014-06-12 NOTE — Patient Instructions (Signed)

## 2014-06-13 LAB — INSULIN, FASTING: INSULIN FASTING, SERUM: 102 u[IU]/mL — AB (ref 3–28)

## 2014-06-13 LAB — VITAMIN D 25 HYDROXY (VIT D DEFICIENCY, FRACTURES): VIT D 25 HYDROXY: 63 ng/mL (ref 30–89)

## 2014-06-14 ENCOUNTER — Other Ambulatory Visit: Payer: Self-pay | Admitting: Internal Medicine

## 2014-06-18 ENCOUNTER — Other Ambulatory Visit: Payer: Self-pay | Admitting: Internal Medicine

## 2014-08-21 ENCOUNTER — Ambulatory Visit: Payer: Self-pay | Admitting: Internal Medicine

## 2014-09-09 ENCOUNTER — Encounter: Payer: Self-pay | Admitting: Gastroenterology

## 2014-09-15 ENCOUNTER — Encounter: Payer: Self-pay | Admitting: Internal Medicine

## 2014-09-15 ENCOUNTER — Ambulatory Visit (INDEPENDENT_AMBULATORY_CARE_PROVIDER_SITE_OTHER): Payer: 59 | Admitting: Physician Assistant

## 2014-09-15 VITALS — BP 118/78 | HR 60 | Temp 98.2°F | Resp 16 | Ht 72.0 in | Wt 178.2 lb

## 2014-09-15 DIAGNOSIS — E559 Vitamin D deficiency, unspecified: Secondary | ICD-10-CM

## 2014-09-15 DIAGNOSIS — R0989 Other specified symptoms and signs involving the circulatory and respiratory systems: Secondary | ICD-10-CM

## 2014-09-15 DIAGNOSIS — E039 Hypothyroidism, unspecified: Secondary | ICD-10-CM

## 2014-09-15 DIAGNOSIS — R7309 Other abnormal glucose: Secondary | ICD-10-CM

## 2014-09-15 DIAGNOSIS — I10 Essential (primary) hypertension: Secondary | ICD-10-CM

## 2014-09-15 DIAGNOSIS — E785 Hyperlipidemia, unspecified: Secondary | ICD-10-CM

## 2014-09-15 DIAGNOSIS — Z79899 Other long term (current) drug therapy: Secondary | ICD-10-CM

## 2014-09-15 DIAGNOSIS — R7303 Prediabetes: Secondary | ICD-10-CM

## 2014-09-15 NOTE — Patient Instructions (Signed)
Morton's Neuroma in Sports  (Interdigital Plantar Neuroma) Morton's neuroma is a condition of the nervous system that results in pain or loss of feeling in the toes. The disease is caused by the bones of the foot squeezing the nerve that runs between two toes (interdigital nerve). The third and fourth toes are most likely to be affected by this disease. SYMPTOMS   Tingling, numbness, burning, or electric shocks in the front of the foot, often involving the third and fourth toes, although it may involve any other pair of toes.  Pain and tenderness in the front of the foot, that gets worse when walking.  Pain that gets worse when pressure is applied to the foot (wearing shoes).  Severe pain in the front of the foot, when standing on the front of the foot (on tiptoes), such as with running, jumping, pivoting, or dancing. CAUSES  Morton's neuroma is caused by swelling of the nerve between two toes. This swelling causes the nerve to be pinched between the bones of the foot. RISK INCREASES WITH:  Recurring foot or ankle injuries.  Poor fitting or worn shoes, with minimal padding and shock absorbers.  Loose ligaments of the foot, causing thickening of the nerve.  Poor foot strength and flexibility. PREVENTION  Warm up and stretch properly before activity.  Maintain physical fitness:  Foot and ankle flexibility.  Muscle strength and endurance.  Cardiovascular fitness.  Wear properly fitted and padded shoes.  Wear arch supports (orthotics), when needed. PROGNOSIS  If treated properly, Morton's neuroma can usually be cured with non-surgical treatment. For certain cases, surgery may be needed. RELATED COMPLICATIONS  Permanent numbness and pain in the foot.  Inability to participate in athletics, because of pain. TREATMENT Treatment first involves stopping any activities that make the symptoms worse. The use of ice and medicine will help reduce pain and inflammation. Wearing shoes  with a wide toe box, and an orthotic arch support or metatarsal bar, may also reduce pain. Your caregiver may give you a corticosteroid injection, to further reduce inflammation. If non-surgical treatment is unsuccessful, surgery may be needed. Surgery to fix Morton's neuroma is often performed as an outpatient procedure, meaning you can go home the same day as the surgery. The procedure involves removing the source of pressure on the nerve. If it is necessary to remove the nerve, you can expect persistent numbness. MEDICATION  If pain medicine is needed, nonsteroidal anti-inflammatory medicines (aspirin and ibuprofen), or other minor pain relievers (acetaminophen), are often advised.  Do not take pain medicine for 7 days before surgery.  Prescription pain relievers are usually prescribed only after surgery. Use only as directed and only as much as you need.  Corticosteroid injections are used in extreme cases, to reduce inflammation. These injections should be done only if necessary, because they may be given only a limited number of times. HEAT AND COLD  Cold treatment (icing) should be applied for 10 to 15 minutes every 2 to 3 hours for inflammation and pain, and immediately after activity that aggravates your symptoms. Use ice packs or an ice massage.  Heat treatment may be used before performing stretching and strengthening activities prescribed by your caregiver, physical therapist, or athletic trainer. Use a heat pack or a warm water soak. SEEK MEDICAL CARE IF:   Symptoms get worse or do not improve in 2 weeks, despite treatment.  After surgery you develop increasing pain, swelling, redness, increased warmth, bleeding, drainage of fluids, or fever.  New, unexplained symptoms develop. (  Drugs used in treatment may produce side effects.) Document Released: 09/14/2005 Document Revised: 01/30/2012 Document Reviewed: 02/19/2009 [facility] Patient Information 2015 [place], [place]. This  information is not intended to replace advice given to you by your health care provider. Make sure you discuss any questions you have with your health care provider. Tarsal Tunnel Syndrome with Rehab Tarsal tunnel syndrome is a condition that involves pressure (compression) on the nerve in the ankle (posterior tibial nerve) and results in pain and loss of feeling on the bottom of the foot. The nerve is usually compressed by other structures within the ankle. SYMPTOMS   Signs of nerve damage: pain, numbness, tingling, and loss of feeling along the bottom of the foot.  Pain that worsens with activity.  Feeling a lack of stability in the ankle. CAUSES  Tarsal tunnel syndrome is caused by structures within the ankle placing pressure on the nerve inside the ankle, which causes sensations in the bottom of the foot. Common sources of pressure include:  Ligament-like tissue (retinaculum) that covers the nerve area in the ankle (tarsal tunnel).  Bony spurs or bumps.  Inflamed tendons (tendonitis). RISK INCREASES WITH:  Stretched ankle ligaments, which create a loose joint.  Flat feet.  Arthritis of the ankle.  Inflammation of tendons in the foot and ankle.  Previous foot or ankle injury. PREVENTION  Warm up and stretch properly before activity.  Maintain physical fitness:  Strength, flexibility, and endurance.  Cardiovascular fitness (increases heart rate).  Wear properly fitted shoes.  Wear arch supports (orthotics), if you have flat feet.  Protect the ankle with taping, braces, or compression bandages. PROGNOSIS  If treated properly, the symptoms of tarsal tunnel syndrome usually go away with non-surgical treatment. Occasionally, surgery is necessary to free the compressed nerve.  RELATED COMPLICATIONS  Permanent nerve damage, including pain, numbness, tingling, or weakness in the ankle. TREATMENT Treatment first involves resting from any activities that aggravate the  symptoms, as well as the use of ice and medicine to reduce pain and inflammation. The use of strengthening and stretching exercises may help reduce pain from activity. Other treatments include wearing arch supports, if you have flat feet, and cross training (training in various physical activities) to reduce stress on the foot and ankle. If symptoms persist, despite non-surgical treatment, then surgery may be recommended. Surgery usually provides full relief from symptoms.  MEDICATION   If pain medicine is necessary, nonsteroidal anti-inflammatory medicines (aspirin and ibuprofen), or other minor pain relievers (acetaminophen), are often recommended.  Do not take pain medication for 7 days before surgery.  Prescription pain relievers may be given if your caregiver thinks they are necessary. Use only as directed and only as much as you need. HEAT AND COLD  Cold treatment (icing) relieves pain and reduces inflammation. Cold treatment should be applied for 10 to 15 minutes every 2 to 3 hours, and immediately after any activity that aggravates your symptoms. Use ice packs or an ice massage.  Heat treatment may be used prior to performing stretching and strengthening activities prescribed by your caregiver, physical therapist, or athletic trainer. Use a heat pack or a warm water soak. SEEK MEDICAL CARE IF:  Treatment does not seem to help, or the condition worsens.  Any medicines produce negative side effects.  Any complications from surgery occur:  Pain, numbness, or coldness in the affected foot.  Discoloration beneath the toenails (blue or gray) of the affected foot.  Signs of infections (fever, pain, inflammation, redness, or persistent bleeding). EXERCISES  RANGE OF MOTION (ROM) AND STRETCHING EXERCISES - Tarsal Tunnel Syndrome (Posterior Tibial Nerve Compression) These exercises may help you when beginning to restore activity to your injured foot. Complete these exercises with caution.  Nerves are very sensitive tissue. They must be exercised gently. Never force a motion and do not push through discomfort. Notify your caregiver of any exercises which increase your pain or worsen your symptoms. Your symptoms may go away with or without further involvement from your physician, physical therapist or athletic trainer. While completing these exercises, remember:   Restoring tissue flexibility helps normal motion to return to the joints. This allows healthier, less painful movement and activity.  An effective stretch should be held for at least 30 seconds.  A stretch should never be painful. You should only feel a gentle lengthening or release in the stretched tissue. STRETCH - Gastroc, Standing  Place hands on wall.  Extend right / left leg behind you and place a folded washcloth under the arch of your foot for support. Keep the front knee somewhat bent.  Slightly point your toes inward on your back foot.  Keeping your right / left heel on the floor and your knee straight, shift your weight toward the wall, not allowing your back to arch.  You should feel a gentle stretch in the right / left calf. Hold this position for __________ seconds. Repeat __________ times. Complete this stretch __________ times per day. STRETCH - Soleus, Standing  Place hands on wall.  Extend right / left leg behind you and place a folded washcloth under the arch of your foot for support. Keep the front knee somewhat bent.  Slightly point your toes inward on your back foot.  Keep your right / left heel on the floor, bend your back knee, and slightly shift your weight over the back leg so that you feel a gentle stretch deep in your back calf.  Hold this position for __________ seconds. Repeat __________ times. Complete this stretch __________ times per day. RANGE OF MOTION - Toe Extension, Flexion  Sit with your right / left leg crossed over your opposite knee.  Grasp your toes and gently pull  them back toward the top of your foot. You should feel a stretch on the bottom of your toes and foot.  Hold this stretch for __________ seconds.  Now, gently pull your toes toward the bottom of your foot. You should feel a stretch on the top of your toes and foot.  Hold this stretch for __________ seconds. Repeat __________ times. Complete this stretch __________ times per day.  RANGE OF MOTION - Ankle Eversion  Sit with your right / left ankle crossed over your opposite knee.  Grip your foot with your opposite hand, placing your thumb on the top of your foot and your fingers across the bottom of your foot.  Gently push your foot downward with a slight rotation so your littlest toes rise slightly toward the ceiling.  You should feel a gentle stretch on the inside of your ankle. Hold the stretch for __________ seconds. Repeat __________ times. Complete this exercise __________ times per day.  RANGE OF MOTION - Ankle Dorsiflexion, Active Assisted  Remove shoes and sit on a chair, preferably not on a carpeted surface.  Place right / left foot on the floor, directly under knee. Extend your opposite leg for support.  Keeping your heel down, slide your right / left foot back toward the chair until you feel a stretch at your ankle  or calf. If you do not feel a stretch, slide your bottom forward to the edge of the chair while still keeping your heel down.  Hold this stretch for __________ seconds. Repeat __________ times. Complete this stretch __________ times per day.  STRETCH - Hamstrings, Supine  Lie on your back. Loop a belt or towel over the ball of your right / left foot.  Straighten your right / left knee and slowly pull on the belt to raise your leg. Do not allow the right / left knee to bend. Keep your opposite leg flat on the floor.  Raise the leg until you feel a gentle stretch behind your right / left knee or thigh. Hold this position for __________ seconds. Repeat __________  times. Complete this stretch __________ times per day.  STRETCH - Hamstrings, Doorway  Lie on your back with your right / left leg extended and resting on the wall and the opposite leg flat on the ground through the door. Initially, position your bottom farther away from the wall than the illustration shows.  Keep your right / left knee straight. If you feel a stretch behind your knee or thigh, hold this position for __________ seconds.  If you do not feel a stretch, scoot your bottom closer to the door and hold __________ seconds. Repeat __________ times. Complete this stretch __________ times per day.  STRETCH - Hamstrings, Standing  Stand or sit, and extend your right / left leg, placing your foot on a chair or foot stool  Keep a slight arch in your low back, and keep your hips straight forward.  Lead with your chest and lean forward at the waist, until you feel a gentle stretch in the back of your right / left knee or thigh. (When done correctly, this exercise requires leaning only a small distance.)  Hold this position for __________ seconds. Repeat __________ times. Complete this stretch __________ times per day. STRENGTHENING EXERCISES - Tarsal Tunnel Syndrome (Posterior Tibial Nerve Compression) These exercises may help you when beginning to restore activity to your injured foot. Your symptoms may go away with or without further involvement from your physician, physical therapist or athletic trainer. While completing these exercises, remember:   Muscles can gain both the endurance and the strength needed for everyday activities through controlled exercises.  Complete these exercises as instructed by your physician, physical therapist or athletic trainer. Increase the resistance and repetitions only as guided.  You may experience muscle soreness or fatigue, but the pain or discomfort you are trying to eliminate should never worsen during these exercises. If this pain does worsen,  stop and make certain you are following the directions exactly. If the pain is still present after adjustments, discontinue the exercise until you can discuss the trouble with your caregiver. STRENGTH - Dorsiflexors  Secure a rubber exercise band or tubing to a fixed object (table, pole) and loop the other end around your right / left foot.  Sit on the floor facing the fixed object. The band should be slightly tense when your foot is relaxed.  Slowly draw your foot back toward you, using your ankle and toes.  Hold this position for __________ seconds. Slowly release the tension in the band and return your foot to the starting position. Repeat __________ times. Complete this exercise __________ times per day.  STRENGTH - Plantar-flexors  Sit with your right / left leg extended. Holding onto both ends of a rubber exercise band or tubing, loop it around the ball  of your foot. Keep a slight tension in the band.  Slowly push your toes away from you, pointing them downward.  Hold this position for __________ seconds. Return to the starting position slowly, controlling the tension in the band. Repeat __________ times. Complete this exercise __________ times per day.  STRENGTH - Plantar-flexors, Standing  Stand with your feet shoulder width apart. Place your hands on a wall or table to steady yourself, using as little support as needed.  Keeping your weight evenly spread over the width of your feet, rise up on your toes.*  Hold this position for __________ seconds. Repeat __________ times. Complete this exercise __________ times per day.  *If this is too easy, shift your weight toward your right / left leg until you feel challenged. Ultimately, you may be asked to do this exercise while standing on your right / left foot only. STRENGTH - Towel Curls  Sit in a chair, on a non-carpeted surface.  Place your foot on a towel, keeping your heel on the floor.  Pull the towel toward your heel only  by curling your toes. Keep your heel on the floor.  If instructed by your physician, physical therapist or athletic trainer, add ____________________ at the end of the towel. Repeat __________ times. Complete this exercise __________ times per day. STRENGTH - Ankle Eversion  Secure one end of a rubber exercise band or tubing to a fixed object (table, pole). Loop the other end around your foot, just before your toes.  Place your fists between your knees. This will focus your strengthening at your ankle.  Drawing the band across your opposite foot, away from the pole, slowly pull your little toe out and up. Make sure the band is positioned to resist the entire motion.  Hold this position for __________ seconds.  Return to the starting position slowly, controlling the tension in the band. Repeat __________ times. Complete this exercise __________ times per day.  STRENGTH - Ankle Inversion  Secure one end of a rubber exercise band or tubing to a fixed object (table, pole). Loop the other end around your foot, just before your toes.  Place your fists between your knees. This will focus your strengthening at your ankle.  Slowly, pull your big toe up and in, making sure the band is positioned to resist the entire motion.  Hold this position for __________ seconds.  Return to the starting position slowly, controlling the tension in the band. Repeat __________ times. Complete this exercises __________ times per day.  Document Released: 11/07/2005 Document Revised: 01/30/2012 Document Reviewed: 02/19/2009 [facility] Patient Information 2015 [place], [place]. This information is not intended to replace advice given to you by your health care provider. Make sure you discuss any questions you have with your health care provider.

## 2014-09-15 NOTE — Progress Notes (Signed)
Assessment and Plan:  Hypertension: Continue medication, monitor blood pressure at home. Continue DASH diet.  Reminder to go to the ER if any CP, SOB, nausea, dizziness, severe HA, changes vision/speech, left arm numbness and tingling, and jaw pain. Cholesterol: Continue diet and exercise. Check cholesterol.  Pre-diabetes-Continue diet and exercise. Check A1C Vitamin D Def- check level and continue medications. ? Morton neuroma versus tarsal tunnel- information given, wear better arch support, if not better will send to podiatry.    Continue diet and meds as discussed. Further disposition pending results of labs.  HPI 68 y.o. male  presents for 3 month follow up with hypertension, hyperlipidemia, prediabetes and vitamin D. His blood pressure has been controlled at home, today their BP is BP: 118/78 mmHg He does workout. He denies chest pain, shortness of breath, dizziness.  He is on cholesterol medication and denies myalgias. His cholesterol is at goal. The cholesterol last visit was:   Lab Results  Component Value Date   CHOL 151 06/12/2014   HDL 44 06/12/2014   LDLCALC 82 06/12/2014   TRIG 124 06/12/2014   CHOLHDL 3.4 06/12/2014   He has been working on diet and exercise for prediabetes, he is down 9 lbs with diet, and denies paresthesia of the feet, polydipsia and polyuria. Last A1C in the office was:  Lab Results  Component Value Date   HGBA1C 6.0* 06/12/2014   Patient is on Vitamin D supplement.   Lab Results  Component Value Date   VD25OH 98 06/12/2014     He is on thyroid medication. His medication was not changed last visit. Patient denies heat / cold intolerance, nervousness and palpitations.  Lab Results  Component Value Date   TSH 2.470 06/12/2014  .   Current Medications:  Current Outpatient Prescriptions on File Prior to Visit  Medication Sig Dispense Refill  . aspirin 325 MG tablet Take 325 mg by mouth every other day. Taking 1/2      . Cholecalciferol (VITAMIN D PO)  Take 6,000 Int'l Units by mouth daily.      . CRESTOR 40 MG tablet TAKE 1 TABLET EVERY DAY FOR CHOLESTEROL  90 tablet  5  . fenofibrate 160 MG tablet TAKE 1 TABLET (160 MG TOTAL) BY MOUTH DAILY.  90 tablet  99  . levothyroxine (SYNTHROID, LEVOTHROID) 100 MCG tablet TAKE 1 TABLET BY MOUTH EVERY DAY  90 tablet  3  . MAGNESIUM PO Take 1,000 mg by mouth daily.      . Multiple Vitamins-Minerals (MULTIVITAMIN PO) Take by mouth daily.      . Omega-3 Fatty Acids (FISH OIL PO) Take by mouth daily.       No current facility-administered medications on file prior to visit.   Medical History:  Past Medical History  Diagnosis Date  . Labile hypertension   . Hyperlipidemia   . Prediabetes   . Hypothyroid   . Vitamin D deficiency    Allergies:  Allergies  Allergen Reactions  . Prednisolone     High doses make her "nervous"     Review of Systems: [X]  = complains of  [ ]  = denies  General: Fatigue [ ]  Fever [ ]  Chills [ ]  Weakness [ ]   Insomnia [ ]  Eyes: Redness [ ]  Blurred vision [ ]  Diplopia [ ]   ENT: Congestion [ ]  Sinus Pain [ ]  Post Nasal Drip [ ]  Sore Throat [ ]  Earache [ ]   Cardiac: Chest pain/pressure [ ]  SOB [ ]  Orthopnea [ ]   Palpitations [ ]   Paroxysmal nocturnal dyspnea[ ]  Claudication [ ]  Edema [ ]   Pulmonary: Cough [ ]  Wheezing[ ]   SOB [ ]   Snoring [ ]   GI: Nausea [ ]  Vomiting[ ]  Dysphagia[ ]  Heartburn[ ]  Abdominal pain [ ]  Constipation [ ] ; Diarrhea [ ] ; BRBPR [ ]  Melena[ ]  GU: Hematuria[ ]  Dysuria [ ]  Nocturia[ ]  Urgency [ ]   Hesitancy [ ]  Discharge [ ]  Neuro: Headaches[ ]  Vertigo[ ]  Paresthesias[ ]  Spasm [ ]  Speech changes [ ]  Incoordination [ ]   Ortho: Arthritis [ ]  Joint pain [ ]  Muscle pain [x ] Joint swelling [ ]  Back Pain [ ]  Skin:  Rash [ ]   Pruritis [ ]  Change in skin lesion [ ]   Psych: Depression[ ]  Anxiety[ ]  Confusion [ ]  Memory loss [ ]   Heme/Lypmh: Bleeding [ ]  Bruising [ ]  Enlarged lymph nodes [ ]   Endocrine: Visual blurring [ ]  Paresthesia [ ]  Polyuria [ ]   Polydypsea [ ]    Heat/cold intolerance [ ]  Hypoglycemia [ ]   Family history- Review and unchanged Social history- Review and unchanged Physical Exam: BP 118/78  Pulse 60  Temp(Src) 98.2 F (36.8 C) (Temporal)  Resp 16  Ht 6' (1.829 m)  Wt 178 lb 3.2 oz (80.831 kg)  BMI 24.16 kg/m2 Wt Readings from Last 3 Encounters:  09/15/14 178 lb 3.2 oz (80.831 kg)  06/12/14 183 lb (83.008 kg)  02/06/14 182 lb 6.4 oz (82.736 kg)   General Appearance: Well nourished, in no apparent distress. Eyes: PERRLA, EOMs, conjunctiva no swelling or erythema Sinuses: No Frontal/maxillary tenderness ENT/Mouth: Ext aud canals clear, TMs without erythema, bulging. No erythema, swelling, or exudate on post pharynx.  Tonsils not swollen or erythematous. Hearing normal.  Neck: Supple, thyroid normal.  Respiratory: Respiratory effort normal, BS equal bilaterally without rales, rhonchi, wheezing or stridor.  Cardio: RRR with no MRGs. Brisk peripheral pulses without edema.  Abdomen: Soft, + BS.  Non tender, no guarding, rebound, hernias, masses. Lymphatics: Non tender without lymphadenopathy.  Musculoskeletal: Full ROM, 5/5 strength, normal gait. + numbness at bilateral lateral 3-5 th toes and lateral plantar, full ROM, good cap refill.  Skin: Warm, dry without rashes, lesions, ecchymosis.  Neuro: Cranial nerves intact. Normal muscle tone, no cerebellar symptoms. Sensation intact.  Psych: Awake and oriented X 3, normal affect, Insight and Judgment appropriate.    Vicie Mutters, PA-C 5:14 PM [facility]

## 2014-09-16 LAB — CBC WITH DIFFERENTIAL/PLATELET
BASOS PCT: 0 % (ref 0–1)
Basophils Absolute: 0 10*3/uL (ref 0.0–0.1)
Eosinophils Absolute: 0.1 10*3/uL (ref 0.0–0.7)
Eosinophils Relative: 1 % (ref 0–5)
HEMATOCRIT: 42.4 % (ref 39.0–52.0)
HEMOGLOBIN: 14.4 g/dL (ref 13.0–17.0)
Lymphocytes Relative: 24 % (ref 12–46)
Lymphs Abs: 1.9 10*3/uL (ref 0.7–4.0)
MCH: 30.2 pg (ref 26.0–34.0)
MCHC: 34 g/dL (ref 30.0–36.0)
MCV: 88.9 fL (ref 78.0–100.0)
MONO ABS: 0.7 10*3/uL (ref 0.1–1.0)
MONOS PCT: 9 % (ref 3–12)
Neutro Abs: 5.3 10*3/uL (ref 1.7–7.7)
Neutrophils Relative %: 66 % (ref 43–77)
Platelets: 265 10*3/uL (ref 150–400)
RBC: 4.77 MIL/uL (ref 4.22–5.81)
RDW: 13.7 % (ref 11.5–15.5)
WBC: 8 10*3/uL (ref 4.0–10.5)

## 2014-09-16 LAB — HEMOGLOBIN A1C
Hgb A1c MFr Bld: 5.9 % — ABNORMAL HIGH (ref <5.7)
Mean Plasma Glucose: 123 mg/dL — ABNORMAL HIGH (ref <117)

## 2014-09-16 LAB — HEPATIC FUNCTION PANEL
ALBUMIN: 4.5 g/dL (ref 3.5–5.2)
ALK PHOS: 41 U/L (ref 39–117)
ALT: 24 U/L (ref 0–53)
AST: 30 U/L (ref 0–37)
Bilirubin, Direct: 0.1 mg/dL (ref 0.0–0.3)
Indirect Bilirubin: 0.3 mg/dL (ref 0.2–1.2)
Total Bilirubin: 0.4 mg/dL (ref 0.2–1.2)
Total Protein: 6.9 g/dL (ref 6.0–8.3)

## 2014-09-16 LAB — BASIC METABOLIC PANEL WITH GFR
BUN: 24 mg/dL — AB (ref 6–23)
CO2: 25 mEq/L (ref 19–32)
CREATININE: 1.58 mg/dL — AB (ref 0.50–1.35)
Calcium: 9.8 mg/dL (ref 8.4–10.5)
Chloride: 105 mEq/L (ref 96–112)
GFR, EST AFRICAN AMERICAN: 52 mL/min — AB
GFR, EST NON AFRICAN AMERICAN: 45 mL/min — AB
GLUCOSE: 93 mg/dL (ref 70–99)
Potassium: 4.2 mEq/L (ref 3.5–5.3)
Sodium: 142 mEq/L (ref 135–145)

## 2014-09-16 LAB — LIPID PANEL
Cholesterol: 135 mg/dL (ref 0–200)
HDL: 39 mg/dL — AB (ref >39)
LDL Cholesterol: 69 mg/dL (ref 0–99)
Total CHOL/HDL Ratio: 3.5 Ratio
Triglycerides: 137 mg/dL (ref <150)
VLDL: 27 mg/dL (ref 0–40)

## 2014-09-16 LAB — INSULIN, FASTING: INSULIN FASTING, SERUM: 9.5 u[IU]/mL (ref 2.0–19.6)

## 2014-09-16 LAB — VITAMIN D 25 HYDROXY (VIT D DEFICIENCY, FRACTURES): VIT D 25 HYDROXY: 69 ng/mL (ref 30–89)

## 2014-09-16 LAB — MAGNESIUM: MAGNESIUM: 2 mg/dL (ref 1.5–2.5)

## 2014-09-16 LAB — TSH: TSH: 1.283 u[IU]/mL (ref 0.350–4.500)

## 2014-09-18 ENCOUNTER — Ambulatory Visit (INDEPENDENT_AMBULATORY_CARE_PROVIDER_SITE_OTHER): Payer: 59 | Admitting: Physician Assistant

## 2014-09-18 ENCOUNTER — Encounter: Payer: Self-pay | Admitting: Physician Assistant

## 2014-09-18 VITALS — BP 128/76 | HR 86 | Temp 100.0°F | Resp 18 | Ht 72.0 in | Wt 179.0 lb

## 2014-09-18 DIAGNOSIS — J029 Acute pharyngitis, unspecified: Secondary | ICD-10-CM

## 2014-09-18 LAB — POCT RAPID STREP A (OFFICE): Rapid Strep A Screen: NEGATIVE

## 2014-09-18 MED ORDER — AZITHROMYCIN 250 MG PO TABS: ORAL_TABLET | ORAL | Status: DC

## 2014-09-18 MED ORDER — BENZONATATE 100 MG PO CAPS: 100.0000 mg | ORAL_CAPSULE | Freq: Four times a day (QID) | ORAL | Status: DC | PRN

## 2014-09-18 NOTE — Progress Notes (Signed)
Subjective:    Patient ID: Tyler Gilbert, male    DOB: [birth year], 68 y.o.   MRN: [id number]  Fever  This is a new problem. Episode onset: Started after last visit here on 09/15/14. His temperature was unmeasured prior to arrival. Associated symptoms include ear pain and a sore throat. Pertinent negatives include no abdominal pain, chest pain, congestion, coughing, diarrhea, headaches, muscle aches, nausea, rash, sleepiness, urinary pain, vomiting or wheezing. Treatments tried: Vitamin D and OTC medication that his wife told him to take. The treatment provided no relief.  Sore Throat  This is a new (Started after last visit on 09/15/14) problem. The problem has been gradually worsening. Sore throat worse side: bilaterally. Associated symptoms include ear pain, a plugged ear sensation and swollen glands. Pertinent negatives include no abdominal pain, congestion, coughing, diarrhea, drooling, ear discharge, headaches, hoarse voice, neck pain, shortness of breath, stridor, trouble swallowing or vomiting. Exposure to: No sick contacts- except when he had his visit here on 09/15/14. He has tried gargles for the symptoms. The treatment provided no relief.  Otalgia  There is pain in both (But left feels worse with congestion) ears. Episode onset: Started after last visit on 09/15/14. The problem has been gradually worsening. The pain is mild. Associated symptoms include a sore throat. Pertinent negatives include no abdominal pain, coughing, diarrhea, drainage, ear discharge, headaches, hearing loss, neck pain, rash, rhinorrhea or vomiting. The treatment provided no relief.   Review of Systems  Constitutional: Positive for fever and fatigue. Negative for chills.  HENT: Positive for ear pain, postnasal drip and sore throat. Negative for congestion, dental problem, drooling, ear discharge, hearing loss, hoarse voice, rhinorrhea, sinus pressure and trouble swallowing.   Eyes: Negative.   Respiratory:  Negative.  Negative for cough, shortness of breath, wheezing and stridor.   Cardiovascular: Negative.  Negative for chest pain.  Gastrointestinal: Negative.  Negative for nausea, vomiting, abdominal pain and diarrhea.  Genitourinary: Negative.   Musculoskeletal: Negative.  Negative for neck pain.  Skin: Negative for rash.  Neurological: Negative.  Negative for headaches.  Psychiatric/Behavioral: Negative.    Past Medical History  Diagnosis Date  . Labile hypertension   . Hyperlipidemia   . Prediabetes   . Hypothyroid   . Vitamin D deficiency    Current Outpatient Prescriptions on File Prior to Visit  Medication Sig Dispense Refill  . aspirin 325 MG tablet Take 325 mg by mouth every other day. Taking 1/2      . Cholecalciferol (VITAMIN D PO) Take 6,000 Int'l Units by mouth daily.      . CRESTOR 40 MG tablet TAKE 1 TABLET EVERY DAY FOR CHOLESTEROL  90 tablet  5  . fenofibrate 160 MG tablet TAKE 1 TABLET (160 MG TOTAL) BY MOUTH DAILY.  90 tablet  99  . levothyroxine (SYNTHROID, LEVOTHROID) 100 MCG tablet TAKE 1 TABLET BY MOUTH EVERY DAY  90 tablet  3  . MAGNESIUM PO Take 1,000 mg by mouth daily.      . Multiple Vitamins-Minerals (MULTIVITAMIN PO) Take by mouth daily.      . Omega-3 Fatty Acids (FISH OIL PO) Take by mouth daily.       No current facility-administered medications on file prior to visit.   Allergies  Allergen Reactions  . Prednisolone     High doses make her "nervous"     BP 128/76  Pulse 86  Temp(Src) 100 F (37.8 C) (Temporal)  Resp 18  Ht 6' (1.829 m)  Wt 179 lb (81.194 kg)  BMI 24.27 kg/m2  SpO2 99%  Objective:   Physical Exam  Vitals reviewed. Constitutional: He is oriented to person, place, and time. He appears well-developed and well-nourished. He has a sickly appearance. No distress.  HENT:  Head: Normocephalic.  Right Ear: Tympanic membrane, external ear and ear canal normal. No drainage or swelling. Tympanic membrane is not injected, not  scarred, not perforated, not erythematous, not retracted and not bulging. No middle ear effusion.  Left Ear: External ear and ear canal normal. Tympanic membrane is erythematous and bulging. Tympanic membrane is not injected, not scarred, not perforated and not retracted.  Nose: Nose normal. Right sinus exhibits no maxillary sinus tenderness and no frontal sinus tenderness. Left sinus exhibits no maxillary sinus tenderness and no frontal sinus tenderness.  Mouth/Throat: Uvula is midline and mucous membranes are normal. Mucous membranes are not pale and not dry. No trismus in the jaw. No uvula swelling. Posterior oropharyngeal erythema present. No oropharyngeal exudate, posterior oropharyngeal edema or tonsillar abscesses.  Eyes: Conjunctivae and lids are normal. Pupils are equal, round, and reactive to light. Right eye exhibits no discharge. Left eye exhibits no discharge. No scleral icterus.  Neck: Trachea normal and normal range of motion. Neck supple. No tracheal tenderness present. No tracheal deviation present.  Cardiovascular: Normal rate, regular rhythm, S1 normal, S2 normal, normal heart sounds, intact distal pulses and normal pulses.  Exam reveals no gallop, no distant heart sounds and no friction rub.   No murmur heard. Pulmonary/Chest: Effort normal and breath sounds normal. No stridor. Not tachypneic and not bradypneic. No respiratory distress. He has no decreased breath sounds. He has no wheezes. He has no rhonchi. He has no rales. He exhibits no tenderness.  Abdominal: Soft. Bowel sounds are normal. There is no tenderness. There is no rebound and no guarding.  Musculoskeletal: Normal range of motion.  Lymphadenopathy:       Head (right side): Submandibular adenopathy present. No submental, no preauricular, no posterior auricular and no occipital adenopathy present.       Head (left side): Submandibular adenopathy present. No submental, no preauricular, no posterior auricular and no  occipital adenopathy present.    He has no cervical adenopathy.       Right: No supraclavicular adenopathy present.       Left: No supraclavicular adenopathy present.  Patient had bilateral tonsillectomy at age 64.  Neurological: He is alert and oriented to person, place, and time.  Skin: Skin is warm, dry and intact. No rash noted. No cyanosis. Nails show no clubbing.  Psychiatric: He has a normal mood and affect. His speech is normal and behavior is normal. Judgment and thought content normal.      Assessment & Plan:  1. Acute pharyngitis, unspecified pharyngitis type - Drink plenty of fluids to stay hydrated. - Take Tylenol for fever and pain. -Acetaminiphen 325mg  orally every 4-6 hours for pain.  Max: 10 per day -Take Benadryl/Diphenhydramine over the counter for sleep- 1 capsule by mouth at bedtime.   -Try steam showers to open your nasal passages.  Drink lots of water to stay hydrated and to thin mucous. -Try saline nasal spray for sinus congestion- Take 2 sprays in each nostril at bedtime.  Make sure you spray towards the outside of each nostril, hold nose close and tilt head back.  This will help the medication get into your sinuses.  - Gargle with 8 oz of salt water ( tsp of salt per 1  qt of water) as often as every 1-2 hours to soothe your throat.  -Throat lozenges (if you are not at risk for choking) or sprays may be used to soothe your throat. - Take Z-Pak as prescribed- azithromycin (ZITHROMAX) 250 MG tablet; Take 2 tablets PO on day 1, then 1 tablet PO Q24H x 4 days  Dispense: 6 tablet; Refill: 0 - Take Tessalon Perles as prescribed for cough/post nasal drip- benzonatate (TESSALON PERLES) 100 MG capsule; Take 1 capsule (100 mg total) by mouth every 6 (six) hours as needed for cough.  Dispense: 60 capsule; Refill: 1 - POCT rapid strep A- Performed due to lack of cough, enlarged anterior LAD- Result was negative.   Discussed medication effects and SE's.  Pt agreed with treatment  plan.  -It can take up to 2 weeks to feel better especially for the post nasal drip cough.    -If you are not getting better in 7-10 days, then please call the office  Dwon Sky, Stephani Police, PA-C 4:41 PM [facility]

## 2014-09-18 NOTE — Patient Instructions (Addendum)
- Drink plenty of fluids to stay hydrated. - Take Tylenol for fever and pain. -Acetaminiphen 325mg  orally every 4-6 hours for pain.  Max: 10 per day -Take Benadryl/Diphenhydramine over the counter for sleep- 1 capsule by mouth at bedtime.   -Try steam showers to open your nasal passages.  Drink lots of water to stay hydrated and to thin mucous. -Try saline nasal spray for sinus congestion- Take 2 sprays in each nostril at bedtime.  Make sure you spray towards the outside of each nostril, hold nose close and tilt head back.  This will help the medication get into your sinuses.   -It can take up to 2 weeks to feel better.    -If you are not getting better in 7-10 days, then please call the office.  Pharyngitis Pharyngitis is redness, pain, and swelling (inflammation) of your pharynx.  CAUSES  Pharyngitis is usually caused by infection. Most of the time, these infections are from viruses (viral) and are part of a cold. However, sometimes pharyngitis is caused by bacteria (bacterial). Pharyngitis can also be caused by allergies. Viral pharyngitis may be spread from person to person by coughing, sneezing, and personal items or utensils (cups, forks, spoons, toothbrushes). Bacterial pharyngitis may be spread from person to person by more intimate contact, such as kissing.  SIGNS AND SYMPTOMS  Symptoms of pharyngitis include:   Sore throat.   Tiredness (fatigue).   Low-grade fever.   Headache.  Joint pain and muscle aches.  Skin rashes.  Swollen lymph nodes.  Plaque-like film on throat or tonsils (often seen with bacterial pharyngitis). DIAGNOSIS  Your health care provider will ask you questions about your illness and your symptoms. Your medical history, along with a physical exam, is often all that is needed to diagnose pharyngitis. Sometimes, a rapid strep test is done. Other lab tests may also be done, depending on the suspected cause.  TREATMENT  Viral pharyngitis will usually get  better in 3-4 days without the use of medicine. Bacterial pharyngitis is treated with medicines that kill germs (antibiotics).  HOME CARE INSTRUCTIONS   Drink enough water and fluids to keep your urine clear or pale yellow.   Only take over-the-counter or prescription medicines as directed by your health care provider:   If you are prescribed antibiotics, make sure you finish them even if you start to feel better.   Do not take aspirin.   Get lots of rest.   Gargle with 8 oz of salt water ( tsp of salt per 1 qt of water) as often as every 1-2 hours to soothe your throat.   Throat lozenges (if you are not at risk for choking) or sprays may be used to soothe your throat. SEEK MEDICAL CARE IF:   You have large, tender lumps in your neck.  You have a rash.  You cough up green, yellow-brown, or bloody spit. SEEK IMMEDIATE MEDICAL CARE IF:   Your neck becomes stiff.  You drool or are unable to swallow liquids.  You vomit or are unable to keep medicines or liquids down.  You have severe pain that does not go away with the use of recommended medicines.  You have trouble breathing (not caused by a stuffy nose). MAKE SURE YOU:   Understand these instructions.  Will watch your condition.  Will get help right away if you are not doing well or get worse. Document Released: 11/07/2005 Document Revised: 08/28/2013 Document Reviewed: 07/15/2013 [facility] Patient Information 2015 [place], [place]. This information is  not intended to replace advice given to you by your health care provider. Make sure you discuss any questions you have with your health care provider.

## 2014-10-07 IMAGING — CR DG CHEST 2V
2 series · 2 of 2 positions shown · non-contrast
Comparison: None.

CLINICAL DATA: Back pain.

EXAM:
CHEST  2 VIEW

[view not recorded (1 of 2)]
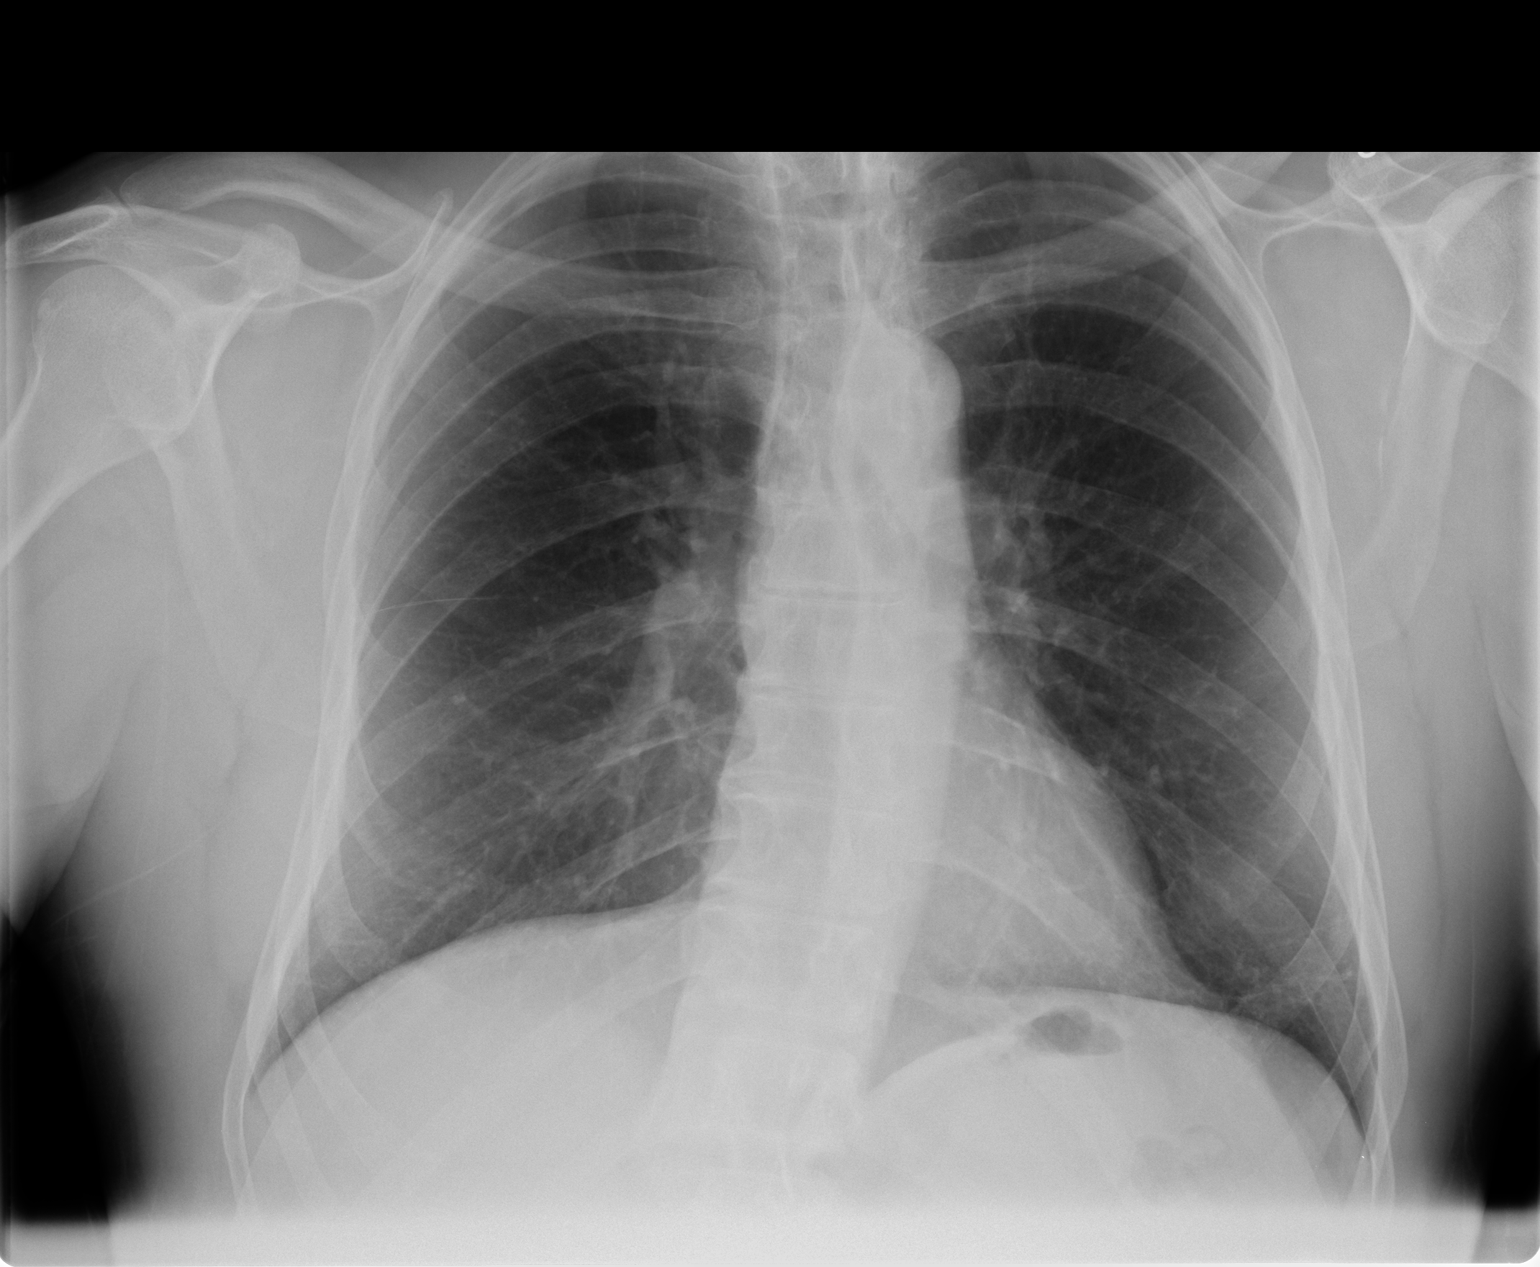

[view not recorded (2 of 2)]
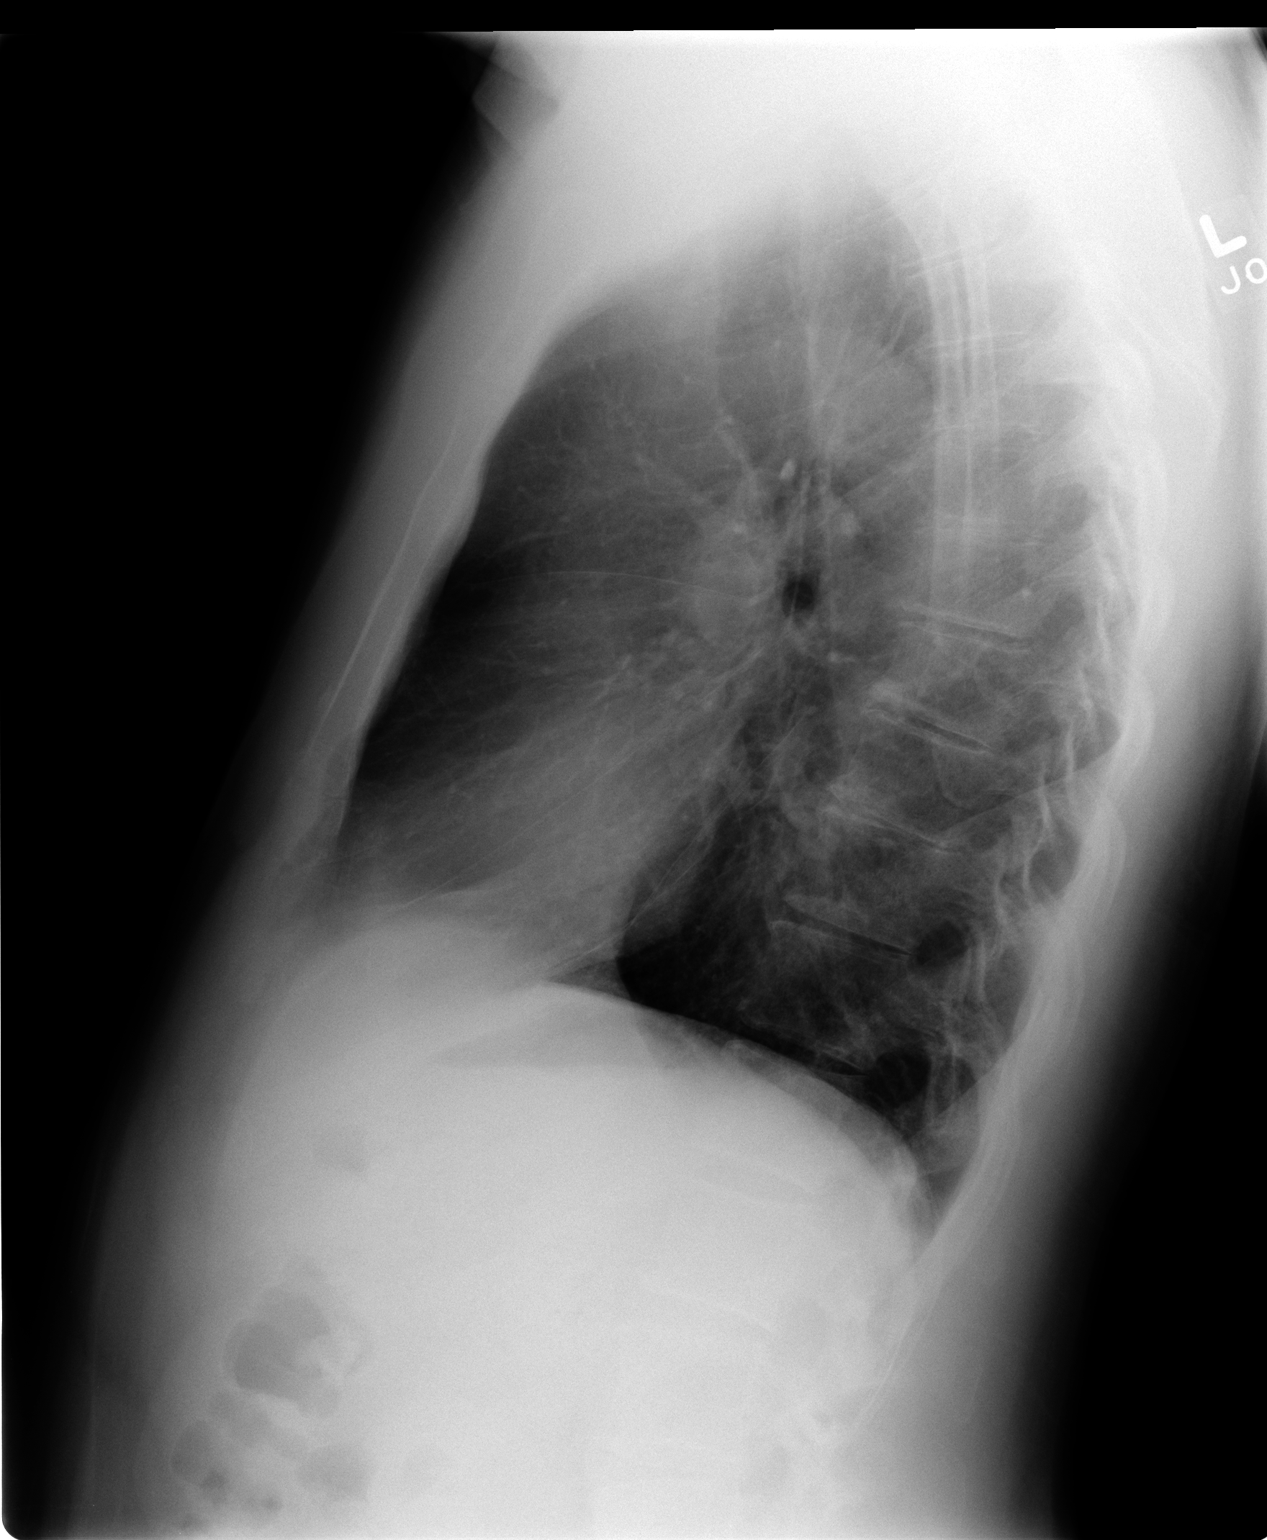

[2 of 2 positions shown; findings below may reference images not displayed]

FINDINGS: Heart size is normal. Mediastinal shadows are normal. The lungs are
clear. No effusions. Mild spinal curvature.
IMPRESSION: No active cardiopulmonary disease.  Spinal curvature.

## 2014-12-18 ENCOUNTER — Encounter: Payer: Self-pay | Admitting: Internal Medicine

## 2014-12-18 ENCOUNTER — Ambulatory Visit (INDEPENDENT_AMBULATORY_CARE_PROVIDER_SITE_OTHER): Payer: 59 | Admitting: Internal Medicine

## 2014-12-18 VITALS — BP 104/66 | HR 56 | Temp 98.1°F | Resp 16 | Ht 72.0 in | Wt 181.8 lb

## 2014-12-18 DIAGNOSIS — Z79899 Other long term (current) drug therapy: Secondary | ICD-10-CM

## 2014-12-18 DIAGNOSIS — R0989 Other specified symptoms and signs involving the circulatory and respiratory systems: Secondary | ICD-10-CM

## 2014-12-18 DIAGNOSIS — R7303 Prediabetes: Secondary | ICD-10-CM

## 2014-12-18 DIAGNOSIS — E039 Hypothyroidism, unspecified: Secondary | ICD-10-CM

## 2014-12-18 DIAGNOSIS — R7309 Other abnormal glucose: Secondary | ICD-10-CM

## 2014-12-18 DIAGNOSIS — E559 Vitamin D deficiency, unspecified: Secondary | ICD-10-CM

## 2014-12-18 DIAGNOSIS — I10 Essential (primary) hypertension: Secondary | ICD-10-CM

## 2014-12-18 DIAGNOSIS — E785 Hyperlipidemia, unspecified: Secondary | ICD-10-CM

## 2014-12-18 NOTE — Patient Instructions (Signed)

## 2014-12-19 ENCOUNTER — Encounter: Payer: Self-pay | Admitting: Internal Medicine

## 2014-12-19 LAB — LIPID PANEL
CHOLESTEROL: 170 mg/dL (ref 0–200)
HDL: 41 mg/dL (ref >39)
LDL Cholesterol: 102 mg/dL — ABNORMAL HIGH (ref 0–99)
Total CHOL/HDL Ratio: 4.1 Ratio
Triglycerides: 137 mg/dL (ref <150)
VLDL: 27 mg/dL (ref 0–40)

## 2014-12-19 LAB — CBC WITH DIFFERENTIAL/PLATELET
Basophils Absolute: 0.1 10*3/uL (ref 0.0–0.1)
Basophils Relative: 1 % (ref 0–1)
Eosinophils Absolute: 0.2 10*3/uL (ref 0.0–0.7)
Eosinophils Relative: 2 % (ref 0–5)
HEMATOCRIT: 42.2 % (ref 39.0–52.0)
Hemoglobin: 14.5 g/dL (ref 13.0–17.0)
LYMPHS ABS: 2.2 10*3/uL (ref 0.7–4.0)
Lymphocytes Relative: 27 % (ref 12–46)
MCH: 30 pg (ref 26.0–34.0)
MCHC: 34.4 g/dL (ref 30.0–36.0)
MCV: 87.4 fL (ref 78.0–100.0)
MPV: 10.6 fL (ref 8.6–12.4)
Monocytes Absolute: 0.7 10*3/uL (ref 0.1–1.0)
Monocytes Relative: 9 % (ref 3–12)
NEUTROS ABS: 4.9 10*3/uL (ref 1.7–7.7)
Neutrophils Relative %: 61 % (ref 43–77)
PLATELETS: 259 10*3/uL (ref 150–400)
RBC: 4.83 MIL/uL (ref 4.22–5.81)
RDW: 13.5 % (ref 11.5–15.5)
WBC: 8 10*3/uL (ref 4.0–10.5)

## 2014-12-19 LAB — VITAMIN D 25 HYDROXY (VIT D DEFICIENCY, FRACTURES): VIT D 25 HYDROXY: 56 ng/mL (ref 30–100)

## 2014-12-19 LAB — HEPATIC FUNCTION PANEL
ALBUMIN: 4.4 g/dL (ref 3.5–5.2)
ALK PHOS: 45 U/L (ref 39–117)
ALT: 21 U/L (ref 0–53)
AST: 25 U/L (ref 0–37)
BILIRUBIN TOTAL: 0.5 mg/dL (ref 0.2–1.2)
Bilirubin, Direct: 0.1 mg/dL (ref 0.0–0.3)
Indirect Bilirubin: 0.4 mg/dL (ref 0.2–1.2)
TOTAL PROTEIN: 7 g/dL (ref 6.0–8.3)

## 2014-12-19 LAB — BASIC METABOLIC PANEL WITH GFR
BUN: 25 mg/dL — ABNORMAL HIGH (ref 6–23)
CO2: 27 mEq/L (ref 19–32)
Calcium: 9.9 mg/dL (ref 8.4–10.5)
Chloride: 104 mEq/L (ref 96–112)
Creat: 1.65 mg/dL — ABNORMAL HIGH (ref 0.50–1.35)
GFR, EST AFRICAN AMERICAN: 49 mL/min — AB
GFR, Est Non African American: 42 mL/min — ABNORMAL LOW
Glucose, Bld: 83 mg/dL (ref 70–99)
Potassium: 4.2 mEq/L (ref 3.5–5.3)
SODIUM: 140 meq/L (ref 135–145)

## 2014-12-19 LAB — HEMOGLOBIN A1C
Hgb A1c MFr Bld: 5.8 % — ABNORMAL HIGH (ref <5.7)
Mean Plasma Glucose: 120 mg/dL — ABNORMAL HIGH (ref <117)

## 2014-12-19 LAB — TSH: TSH: 3.665 u[IU]/mL (ref 0.350–4.500)

## 2014-12-19 LAB — MAGNESIUM: MAGNESIUM: 2.2 mg/dL (ref 1.5–2.5)

## 2014-12-19 LAB — INSULIN, FASTING: INSULIN FASTING, SERUM: 5.9 u[IU]/mL (ref 2.0–19.6)

## 2014-12-19 NOTE — Progress Notes (Signed)
Patient ID: Tyler Gilbert, male   DOB: Mar 25, 1946, 69 y.o.   MRN: [id number]   This very nice 69 y.o.male presents for 3 month follow up with Hypertension, Hyperlipidemia, Pre-Diabetes and Vitamin D Deficiency.    Patient is treated for HTN & BP has been controlled at home. Today's BP: 104/66 mmHg. Patient has had no complaints of any cardiac type chest pain, palpitations, dyspnea/orthopnea/PND, dizziness, claudication, or dependent edema.   Hyperlipidemia is controlled with diet & meds. Patient denies myalgias or other med SE's. Last Lipids were 09/15/2014: Cholesterol, Total 135; HDL Cholesterol by NMR 39; LDL  69; Trig 137 on    Also, the patient has history of PreDiabetes and has had no symptoms of reactive hypoglycemia, diabetic polys, paresthesias or visual blurring.  Last A1c was  5.8% on 12/18/2014.    Further, the patient also has history of Vitamin D Deficiency and supplements vitamin D without any suspected side-effects. Last vitamin D was  on 09/15/2014.  Medication Sig  . aspirin 325 MG tablet Take 325 mg by mouth every other day. Taking 1/2  . Cholecalciferol (VITAMIN D PO) Take 6,000 Int'l Units by mouth daily.  . CRESTOR 40 MG tablet TAKE 1 TABLET EVERY DAY FOR CHOLESTEROL  . fenofibrate 160 MG tablet TAKE 1 TABLET (160 MG TOTAL) BY MOUTH DAILY.  Marland Kitchen levothyroxine100 MCG tablet TAKE 1 TABLET BY MOUTH EVERY DAY  . MAGNESIUM PO Take 1,000 mg by mouth daily.  . MULTIVITAMIN  Take by mouth daily.  Marland Kitchen FISH OIL  Take by mouth daily.   No facility-administered medications prior to visit.    Allergies  Allergen Reactions  . Prednisolone     High doses make her "nervous"    PMHx:   Past Medical History  Diagnosis Date  . Labile hypertension   . Hyperlipidemia   . Prediabetes   . Hypothyroid   . Vitamin D deficiency    Immunization History  Administered Date(s) Administered  . PPD Test 02/06/2014  . Pneumococcal Polysaccharide-23 01/26/2012  . Tdap 12/04/2008  .  Zoster 11/07/2013   Past Surgical History  Procedure Laterality Date  . Orthopedic surgery Left 1991    left hip in [place]  . Other surgical history      Tonsillectomy bilateral at age 68   FHx:    Reviewed / unchanged  SHx:    Reviewed / unchanged  Systems Review:  Constitutional: Denies fever, chills, wt changes, headaches, insomnia, fatigue, night sweats, change in appetite. Eyes: Denies redness, blurred vision, diplopia, discharge, itchy, watery eyes.  ENT: Denies discharge, congestion, post nasal drip, epistaxis, sore throat, earache, hearing loss, dental pain, tinnitus, vertigo, sinus pain, snoring.  CV: Denies chest pain, palpitations, irregular heartbeat, syncope, dyspnea, diaphoresis, orthopnea, PND, claudication or edema. Respiratory: denies cough, dyspnea, DOE, pleurisy, hoarseness, laryngitis, wheezing.  Gastrointestinal: Denies dysphagia, odynophagia, heartburn, reflux, water brash, abdominal pain or cramps, nausea, vomiting, bloating, diarrhea, constipation, hematemesis, melena, hematochezia  or hemorrhoids. Genitourinary: Denies dysuria, frequency, urgency, nocturia, hesitancy, discharge, hematuria or flank pain. Musculoskeletal: Denies arthralgias, myalgias, stiffness, jt. swelling, pain, limping or strain/sprain.  Skin: Denies pruritus, rash, hives, warts, acne, eczema or change in skin lesion(s). Neuro: No weakness, tremor, incoordination, spasms, paresthesia or pain. Psychiatric: Denies confusion, memory loss or sensory loss. Endo: Denies change in weight, skin or hair change.  Heme/Lymph: No excessive bleeding, bruising or enlarged lymph nodes.  Physical Exam  BP 104/66   Pulse 56  Temp 98.1 F   Resp 16  Ht 6'   Wt 181 lb 12.8 oz     BMI 24.65   Appears well nourished and in no distress. Eyes: PERRLA, EOMs, conjunctiva no swelling or erythema. Sinuses: No frontal/maxillary tenderness ENT/Mouth: EAC's clear, TM's nl w/o erythema, bulging. Nares clear w/o  erythema, swelling, exudates. Oropharynx clear without erythema or exudates. Oral hygiene is good. Tongue normal, non obstructing. Hearing intact.  Neck: Supple. Thyroid nl. Car 2+/2+ without bruits, nodes or JVD. Chest: Respirations nl with BS clear & equal w/o rales, rhonchi, wheezing or stridor.  Cor: Heart sounds normal w/ regular rate and rhythm without sig. murmurs, gallops, clicks, or rubs. Peripheral pulses normal and equal  without edema.  Abdomen: Soft & bowel sounds normal. Non-tender w/o guarding, rebound, hernias, masses, or organomegaly.  Lymphatics: Unremarkable.  Musculoskeletal: Full ROM all peripheral extremities, joint stability, 5/5 strength, and normal gait.  Skin: Warm, dry without exposed rashes, lesions or ecchymosis apparent.  Neuro: Cranial nerves intact, reflexes equal bilaterally. Sensory-motor testing grossly intact. Tendon reflexes grossly intact.  Pysch: Alert & oriented x 3.  Insight and judgement nl & appropriate. No ideations.  Assessment and Plan:  1. Hypertension - Continue monitor blood pressure at home. Continue diet/meds same.  2. Hyperlipidemia - Continue diet/meds, exercise,& lifestyle modifications. Continue monitor periodic cholesterol/liver & renal functions   3. Pre-Diabetes - Continue diet, exercise, lifestyle modifications. Monitor appropriate labs.  4. Vitamin D Deficiency - Continue supplementation.   Recommended regular exercise, BP monitoring, weight control, and discussed med and SE's. Recommended labs to assess and monitor clinical status. Further disposition pending results of labs.

## 2015-02-09 ENCOUNTER — Encounter: Payer: Self-pay | Admitting: Internal Medicine

## 2015-02-09 ENCOUNTER — Other Ambulatory Visit: Payer: Self-pay | Admitting: *Deleted

## 2015-02-09 MED ORDER — ROSUVASTATIN CALCIUM 40 MG PO TABS: ORAL_TABLET | ORAL | Status: DC

## 2015-02-09 MED ORDER — FENOFIBRATE 160 MG PO TABS: ORAL_TABLET | ORAL | Status: DC

## 2015-03-07 ENCOUNTER — Other Ambulatory Visit: Payer: Self-pay | Admitting: Internal Medicine

## 2015-03-23 ENCOUNTER — Encounter: Payer: Self-pay | Admitting: Internal Medicine

## 2015-05-04 ENCOUNTER — Encounter: Payer: Self-pay | Admitting: Internal Medicine

## 2015-05-04 ENCOUNTER — Ambulatory Visit (INDEPENDENT_AMBULATORY_CARE_PROVIDER_SITE_OTHER): Payer: 59 | Admitting: Internal Medicine

## 2015-05-04 VITALS — BP 122/76 | HR 56 | Temp 97.3°F | Resp 16 | Ht 72.25 in | Wt 182.2 lb

## 2015-05-04 DIAGNOSIS — I10 Essential (primary) hypertension: Secondary | ICD-10-CM

## 2015-05-04 DIAGNOSIS — Z125 Encounter for screening for malignant neoplasm of prostate: Secondary | ICD-10-CM

## 2015-05-04 DIAGNOSIS — R5383 Other fatigue: Secondary | ICD-10-CM

## 2015-05-04 DIAGNOSIS — R7303 Prediabetes: Secondary | ICD-10-CM

## 2015-05-04 DIAGNOSIS — R7309 Other abnormal glucose: Secondary | ICD-10-CM

## 2015-05-04 DIAGNOSIS — E559 Vitamin D deficiency, unspecified: Secondary | ICD-10-CM

## 2015-05-04 DIAGNOSIS — E785 Hyperlipidemia, unspecified: Secondary | ICD-10-CM

## 2015-05-04 DIAGNOSIS — Z111 Encounter for screening for respiratory tuberculosis: Secondary | ICD-10-CM

## 2015-05-04 DIAGNOSIS — Z1212 Encounter for screening for malignant neoplasm of rectum: Secondary | ICD-10-CM

## 2015-05-04 DIAGNOSIS — R0989 Other specified symptoms and signs involving the circulatory and respiratory systems: Secondary | ICD-10-CM

## 2015-05-04 DIAGNOSIS — Z79899 Other long term (current) drug therapy: Secondary | ICD-10-CM

## 2015-05-04 DIAGNOSIS — E039 Hypothyroidism, unspecified: Secondary | ICD-10-CM

## 2015-05-04 LAB — CBC WITH DIFFERENTIAL/PLATELET
BASOS ABS: 0 10*3/uL (ref 0.0–0.1)
Basophils Relative: 0 % (ref 0–1)
Eosinophils Absolute: 0.2 10*3/uL (ref 0.0–0.7)
Eosinophils Relative: 3 % (ref 0–5)
HCT: 41.7 % (ref 39.0–52.0)
HEMOGLOBIN: 14.4 g/dL (ref 13.0–17.0)
Lymphocytes Relative: 29 % (ref 12–46)
Lymphs Abs: 2 10*3/uL (ref 0.7–4.0)
MCH: 29.9 pg (ref 26.0–34.0)
MCHC: 34.5 g/dL (ref 30.0–36.0)
MCV: 86.7 fL (ref 78.0–100.0)
MPV: 10.4 fL (ref 8.6–12.4)
Monocytes Absolute: 0.7 10*3/uL (ref 0.1–1.0)
Monocytes Relative: 10 % (ref 3–12)
NEUTROS ABS: 3.9 10*3/uL (ref 1.7–7.7)
Neutrophils Relative %: 58 % (ref 43–77)
PLATELETS: 250 10*3/uL (ref 150–400)
RBC: 4.81 MIL/uL (ref 4.22–5.81)
RDW: 13.3 % (ref 11.5–15.5)
WBC: 6.8 10*3/uL (ref 4.0–10.5)

## 2015-05-04 NOTE — Patient Instructions (Signed)

## 2015-05-04 NOTE — Progress Notes (Signed)
Patient ID: Tyler Gilbert, male   DOB: 1946-08-06, 69 y.o.   MRN: [id number]  Annual Comprehensive Examination  This very nice 69 y.o. MWM presents for complete physical.  Patient has been followed for HTN, Prediabetes, Hyperlipidemia, and Vitamin D Deficiency.   Patient's labile HTN predates since 2008 and has been monitored expectantly. Patient's BP has been controlled at home.Today's BP: 122/76 mmHg. Patient denies any cardiac symptoms as chest pain, palpitations, shortness of breath, dizziness or ankle swelling.   Patient's hyperlipidemia is controlled with diet and medications. Patient denies myalgias or other medication SE's. Last lipids were  at goal - Chol 170; HDL 41; LDL 102; Trig 137 on 12/18/2014.   Patient has prediabetes since 2012 with A1c 5.8% and 6.1% in Oct 2014 and patient denies reactive hypoglycemic symptoms, visual blurring, diabetic polys or paresthesias. Last A1c was improved at 5.8% on 12/18/2014.      Finally, patient has history of Vitamin D Deficiency of 30 in 2008 and last vitamin D was 56 on 12/18/2014.      Medication Sig  . aspirin 325 MG tablet Take 325 mg by mouth every other day. Taking 1/2  . VITAMIN D  Take 6,000 Int'l Units  daily.  . fenofibrate 160 MG tablet TAKE 1 TAB DAILY.  Marland Kitchen levothyroxine  100 MCG  Take 1 tablet daily or as directed on an empty stomach for 30-60 minutes  . MAGNESIUM PO Take 1,000 mg  daily.  . Multiple Vitamins-Minerals  Take  daily.  Marland Kitchen FISH OIL  Take  daily.  . rosuvastatin  40 MG tablet TAKE 1 TAB EVERY DAY    Allergies  Allergen Reactions  . Prednisolone     High doses make her "nervous"   Past Medical History  Diagnosis Date  . Labile hypertension   . Hyperlipidemia   . Prediabetes   . Hypothyroid   . Vitamin D deficiency    Health Maintenance  Topic Date Due  . PNA vac Low Risk Adult (1 of 2 - PCV13) 01/25/2013  . INFLUENZA VACCINE  06/22/2015  . TETANUS/TDAP  12/04/2018  . COLONOSCOPY  02/07/2019  . ZOSTAVAX   Completed   Immunization History  Administered Date(s) Administered  . PPD Test 02/06/2014  . Pneumococcal Polysaccharide-23 01/26/2012  . Tdap 12/04/2008  . Zoster 11/07/2013   Past Surgical History  Procedure Laterality Date  . Orthopedic surgery Left 1991    left hip in [place]  . Other surgical history      Tonsillectomy bilateral at age 13   Family History  Problem Relation Age of Onset  . Diverticulosis Mother   . Cancer Mother   . Lymphoma Mother   . Heart disease Father   . Heart attack Father   . Colitis Brother    History   Social History  . Marital Status: Married    Spouse Name: N/A  . Number of Children: N/A  . Years of Education: N/A   Occupational History  . 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 Smoking status: Never Smoker   . Smokeless tobacco: Never Used  . Alcohol Use: No  . Drug Use: No  . Sexual Activity: Active    ROS Constitutional: Denies fever, chills, weight loss/gain, headaches, insomnia,  night sweats or change in appetite. Does c/o fatigue. Eyes: Denies redness, blurred vision, diplopia, discharge, itchy or watery eyes.  ENT: Denies discharge, congestion, post nasal drip, epistaxis, sore throat, earache, hearing  loss, dental pain, Tinnitus, Vertigo, Sinus pain or snoring.  Cardio: Denies chest pain, palpitations, irregular heartbeat, syncope, dyspnea, diaphoresis, orthopnea, PND, claudication or edema Respiratory: denies cough, dyspnea, DOE, pleurisy, hoarseness, laryngitis or wheezing.  Gastrointestinal: Denies dysphagia, heartburn, reflux, water brash, pain, cramps, nausea, vomiting, bloating, diarrhea, constipation, hematemesis, melena, hematochezia, jaundice or hemorrhoids Genitourinary: Denies dysuria, urgency, nocturia, discharge, hematuria or flank pain. C/o frequency, urgency, nocturia.  Musculoskeletal: Denies arthralgia, myalgia, stiffness, Jt. Swelling, pain, limp or strain/sprain. Denies Falls. Skin:  Denies puritis, rash, hives, warts, acne, eczema or change in skin lesion Neuro: No weakness, tremor, incoordination, spasms, paresthesia or pain Psychiatric: Denies confusion, memory loss or sensory loss. Denies Depression. Endocrine: Denies change in weight, skin, hair change, nocturia, and paresthesia, diabetic polys, visual blurring or hyper / hypo glycemic episodes.  Heme/Lymph: No excessive bleeding, bruising or enlarged lymph nodes.  Physical Exam  BP 122/76   Pulse 56  Temp 97.3 F   Resp 16  Ht 6' 0.25"   Wt 182 lb 3.2 oz     BMI 24.54  General Appearance: Well nourished, in no apparent distress. Eyes: PERRLA, EOMs, conjunctiva no swelling or erythema, normal fundi and vessels. Sinuses: No frontal/maxillary tenderness ENT/Mouth: EACs patent / TMs  nl. Nares clear without erythema, swelling, mucoid exudates. Oral hygiene is good. No erythema, swelling, or exudate. Tongue normal, non-obstructing. Tonsils not swollen or erythematous. Hearing normal.  Neck: Supple, thyroid normal. No bruits, nodes or JVD. Respiratory: Respiratory effort normal.  BS equal and clear bilateral without rales, rhonci, wheezing or stridor. Cardio: Heart sounds are normal with regular rate and rhythm and no murmurs, rubs or gallops. Peripheral pulses are normal and equal bilaterally without edema. No aortic or femoral bruits. Chest: symmetric with normal excursions and percussion.  Abdomen: Flat, soft, with bowel sounds. Nontender, no guarding, rebound, hernias, masses, or organomegaly.  Lymphatics: Non tender without lymphadenopathy.  Genitourinary: Deferred to Dr Risa Grill Musculoskeletal: Full ROM all peripheral extremities, joint stability, 5/5 strength, and normal gait. Skin: Warm and dry without rashes, lesions, cyanosis, clubbing or  ecchymosis.  Neuro: Cranial nerves intact, reflexes equal bilaterally. Normal muscle tone, no cerebellar symptoms. Sensation intact.  Pysch: Awake and oriented X 3 with  normal affect, insight and judgment appropriate.   Assessment and Plan  1. Labile hypertension  - Microalbumin / creatinine urine ratio - EKG 12-Lead - [place], RETROPERITNL ABD,  LTD - TSH  2. Hyperlipidemia  - Lipid panel  3. Prediabetes  - Hemoglobin A1c - Insulin, random  4. Vitamin D deficiency  - Vit D  25 hydroxy   5. Hypothyroidism   6. Screening for rectal cancer  - POC Hemoccult Bld/Stl   7. Prostate cancer screening  - followed by Dr Risa Grill annually for elevated PSA   8. Encounter for long-term (current) use of medications  - Urine Microscopic - CBC with Differential/Platelet - BASIC METABOLIC PANEL WITH GFR - Hepatic function panel - Magnesium  9. Other fatigue  - Vitamin B12 - Testosterone - Iron and TIBC - TSH  10. Screening examination for pulmonary tuberculosis  - PPD   Continue prudent diet as discussed, weight control, BP monitoring, regular exercise, and medications as discussed.  Discussed med effects and SE's. Routine screening labs and tests as requested with regular follow-up as recommended.  Over 40 minutes of exam, counseling &  chart review was performed

## 2015-05-05 LAB — TSH: TSH: 3.294 u[IU]/mL (ref 0.350–4.500)

## 2015-05-05 LAB — MICROALBUMIN / CREATININE URINE RATIO
CREATININE, URINE: 79.8 mg/dL
MICROALB UR: 0.3 mg/dL (ref <2.0)
Microalb Creat Ratio: 3.8 mg/g (ref 0.0–30.0)

## 2015-05-05 LAB — IRON AND TIBC
%SAT: 21 % (ref 20–55)
IRON: 97 ug/dL (ref 42–165)
TIBC: 457 ug/dL — ABNORMAL HIGH (ref 215–435)
UIBC: 360 ug/dL (ref 125–400)

## 2015-05-05 LAB — LIPID PANEL
CHOL/HDL RATIO: 3.7 ratio
Cholesterol: 163 mg/dL (ref 0–200)
HDL: 44 mg/dL (ref >=40)
LDL Cholesterol: 93 mg/dL (ref 0–99)
TRIGLYCERIDES: 131 mg/dL (ref <150)
VLDL: 26 mg/dL (ref 0–40)

## 2015-05-05 LAB — HEPATIC FUNCTION PANEL
ALBUMIN: 4.6 g/dL (ref 3.5–5.2)
ALT: 20 U/L (ref 0–53)
AST: 26 U/L (ref 0–37)
Alkaline Phosphatase: 49 U/L (ref 39–117)
BILIRUBIN DIRECT: 0.1 mg/dL (ref 0.0–0.3)
Indirect Bilirubin: 0.3 mg/dL (ref 0.2–1.2)
TOTAL PROTEIN: 7.1 g/dL (ref 6.0–8.3)
Total Bilirubin: 0.4 mg/dL (ref 0.2–1.2)

## 2015-05-05 LAB — URINALYSIS, MICROSCOPIC ONLY
Bacteria, UA: NONE SEEN
CRYSTALS: NONE SEEN
Casts: NONE SEEN
Squamous Epithelial / LPF: NONE SEEN

## 2015-05-05 LAB — INSULIN, RANDOM: Insulin: 7.2 u[IU]/mL (ref 2.0–19.6)

## 2015-05-05 LAB — BASIC METABOLIC PANEL WITH GFR
BUN: 21 mg/dL (ref 6–23)
CO2: 26 mEq/L (ref 19–32)
Calcium: 9.8 mg/dL (ref 8.4–10.5)
Chloride: 104 mEq/L (ref 96–112)
Creat: 1.59 mg/dL — ABNORMAL HIGH (ref 0.50–1.35)
GFR, Est African American: 51 mL/min — ABNORMAL LOW
GFR, Est Non African American: 44 mL/min — ABNORMAL LOW
GLUCOSE: 86 mg/dL (ref 70–99)
POTASSIUM: 4.1 meq/L (ref 3.5–5.3)
Sodium: 141 mEq/L (ref 135–145)

## 2015-05-05 LAB — VITAMIN D 25 HYDROXY (VIT D DEFICIENCY, FRACTURES): Vit D, 25-Hydroxy: 55 ng/mL (ref 30–100)

## 2015-05-05 LAB — HEMOGLOBIN A1C
HEMOGLOBIN A1C: 6 % — AB (ref <5.7)
MEAN PLASMA GLUCOSE: 126 mg/dL — AB (ref <117)

## 2015-05-05 LAB — TESTOSTERONE: TESTOSTERONE: 271 ng/dL — AB (ref 300–890)

## 2015-05-05 LAB — VITAMIN B12: Vitamin B-12: 467 pg/mL (ref 211–911)

## 2015-05-05 LAB — MAGNESIUM: Magnesium: 2.1 mg/dL (ref 1.5–2.5)

## 2015-05-07 LAB — TB SKIN TEST
INDURATION: 0 mm
TB Skin Test: NEGATIVE

## 2015-05-11 ENCOUNTER — Encounter: Payer: Self-pay | Admitting: Gastroenterology

## 2015-05-19 ENCOUNTER — Other Ambulatory Visit (INDEPENDENT_AMBULATORY_CARE_PROVIDER_SITE_OTHER): Payer: 59

## 2015-05-19 DIAGNOSIS — Z1212 Encounter for screening for malignant neoplasm of rectum: Secondary | ICD-10-CM

## 2015-05-19 LAB — POC HEMOCCULT BLD/STL (HOME/3-CARD/SCREEN)
Card #2 Fecal Occult Blod, POC: NEGATIVE
FECAL OCCULT BLD: NEGATIVE
Fecal Occult Blood, POC: NEGATIVE

## 2015-05-30 ENCOUNTER — Other Ambulatory Visit: Payer: Self-pay | Admitting: Internal Medicine

## 2015-06-15 ENCOUNTER — Ambulatory Visit (AMBULATORY_SURGERY_CENTER): Payer: Self-pay

## 2015-06-15 VITALS — Ht 72.0 in | Wt 182.0 lb

## 2015-06-15 DIAGNOSIS — Z86010 Personal history of colonic polyps: Secondary | ICD-10-CM

## 2015-06-15 NOTE — Progress Notes (Signed)
Not on home 02 No egg or soy allergies No hx of anesthesia complications

## 2015-07-03 ENCOUNTER — Encounter: Payer: Self-pay | Admitting: Gastroenterology

## 2015-07-03 ENCOUNTER — Ambulatory Visit (AMBULATORY_SURGERY_CENTER): Payer: 59 | Admitting: Gastroenterology

## 2015-07-03 VITALS — BP 115/59 | HR 54 | Temp 98.5°F | Resp 29 | Ht 72.0 in | Wt 182.0 lb

## 2015-07-03 DIAGNOSIS — K621 Rectal polyp: Secondary | ICD-10-CM

## 2015-07-03 DIAGNOSIS — D120 Benign neoplasm of cecum: Secondary | ICD-10-CM | POA: Diagnosis not present

## 2015-07-03 DIAGNOSIS — D128 Benign neoplasm of rectum: Secondary | ICD-10-CM

## 2015-07-03 DIAGNOSIS — K5730 Diverticulosis of large intestine without perforation or abscess without bleeding: Secondary | ICD-10-CM

## 2015-07-03 DIAGNOSIS — Z86010 Personal history of colonic polyps: Secondary | ICD-10-CM

## 2015-07-03 MED ORDER — SODIUM CHLORIDE 0.9 % IV SOLN: 500.0000 mL | INTRAVENOUS | Status: DC

## 2015-07-03 NOTE — Op Note (Signed)
[place]  [address] [place], [phone]   COLONOSCOPY PROCEDURE REPORT  PATIENT: Marce, Charlesworth  MR#: [id number] BIRTHDATE: 02-Jun-1946 , 61  yrs. old GENDER: male ENDOSCOPIST: Inda Castle, MD REFERRED PZ:WCHENID Melford Aase, M.D. PROCEDURE DATE:  07/03/2015 PROCEDURE:   Colonoscopy, surveillance , Colonoscopy with snare polypectomy, and Colonoscopy with cold biopsy polypectomy First Screening Colonoscopy - Avg.  risk and is 50 yrs.  old or older - No.  Prior Negative Screening - Now for repeat screening. N/A  History of Adenoma - Now for follow-up colonoscopy & has been > or = to 3 yrs.  Yes hx of adenoma.  Has been 3 or more years since last colonoscopy.  Polyps removed today? Yes ASA CLASS:   Class II INDICATIONS:PH Colon Adenoma. 2005 and 2010 MEDICATIONS: Monitored anesthesia care and Propofol 175 mg IV  DESCRIPTION OF PROCEDURE:   After the risks benefits and alternatives of the procedure were thoroughly explained, informed consent was obtained.  The digital rectal exam revealed no abnormalities of the rectum.   The LB [id number]  endoscope was introduced through the anus and advanced to the cecum, which was identified by both the appendix and ileocecal valve. No adverse events experienced.   The quality of the prep was (Suprep was used) excellent.  The instrument was then slowly withdrawn as the colon was fully examined. Estimated blood loss is zero unless otherwise noted in this procedure report.      COLON FINDINGS: A sessile polyp measuring 3 mm in size was found at the cecum.  A polypectomy was performed with a cold snare.  The resection was complete, the polyp tissue was completely retrieved and sent to histology.   A flat polyp measuring 1 mm in size was found in the rectum.  A polypectomy was performed with cold forceps.   There was mild diverticulosis noted in the sigmoid colon.  Retroflexed views revealed no abnormalities. The  time to cecum = 3.9 Withdrawal time = 8.6   The scope was withdrawn and the procedure completed. COMPLICATIONS: There were no immediate complications.  ENDOSCOPIC IMPRESSION: 1.   Sessile polyp was found at the cecum; polypectomy was performed with a cold snare 2.   Flat polyp was found in the rectum; polypectomy was performed with cold forceps 3.   Mild diverticulosis was noted in the sigmoid colon  RECOMMENDATIONS: If the polyp(s) removed today are proven to be adenomatous (pre-cancerous) polyps, you will need a repeat colonoscopy in 5 years.  Otherwise you should continue to follow colorectal cancer screening guidelines for "routine risk" patients with colonoscopy in 10 years.  You will receive a letter within 1-2 weeks with the results of your biopsy as well as final recommendations.  Please call my office if you have not received a letter after 3 weeks.  eSigned:  Inda Castle, MD 07/03/2015 3:25 PM   cc:   PATIENT NAME:  Maxximus, Gotay MR#: [id number]

## 2015-07-03 NOTE — Progress Notes (Signed)
Transferred to recovery room. A/O x3, pleased with MAC.  VSS.  Report to Annette, RN. 

## 2015-07-03 NOTE — Patient Instructions (Signed)
YOU HAD AN ENDOSCOPIC PROCEDURE TODAY AT [facility]:   Refer to the procedure report that was given to you for any specific questions about what was found during the examination.  If the procedure report does not answer your questions, please call your gastroenterologist to clarify.  If you requested that your care partner not be given the details of your procedure findings, then the procedure report has been included in a sealed envelope for you to review at your convenience later.  YOU SHOULD EXPECT: Some feelings of bloating in the abdomen. Passage of more gas than usual.  Walking can help get rid of the air that was put into your GI tract during the procedure and reduce the bloating. If you had a lower endoscopy (such as a colonoscopy or flexible sigmoidoscopy) you may notice spotting of blood in your stool or on the toilet paper. If you underwent a bowel prep for your procedure, you may not have a normal bowel movement for a few days.  Please Note:  You might notice some irritation and congestion in your nose or some drainage.  This is from the oxygen used during your procedure.  There is no need for concern and it should clear up in a day or so.  SYMPTOMS TO REPORT IMMEDIATELY:   Following lower endoscopy (colonoscopy or flexible sigmoidoscopy):  Excessive amounts of blood in the stool  Significant tenderness or worsening of abdominal pains  Swelling of the abdomen that is new, acute  Fever of 100F or higher   For urgent or emergent issues, a gastroenterologist can be reached at any hour by calling [phone].   DIET: Your first meal following the procedure should be a small meal and then it is ok to progress to your normal diet. Heavy or fried foods are harder to digest and may make you feel nauseous or bloated.  Likewise, meals heavy in dairy and vegetables can increase bloating.  Drink plenty of fluids but you should avoid alcoholic beverages for 24  hours.  ACTIVITY:  You should plan to take it easy for the rest of today and you should NOT DRIVE or use heavy machinery until tomorrow (because of the sedation medicines used during the test).    FOLLOW UP: Our staff will call the number listed on your records the next business day following your procedure to check on you and address any questions or concerns that you may have regarding the information given to you following your procedure. If we do not reach you, we will leave a message.  However, if you are feeling well and you are not experiencing any problems, there is no need to return our call.  We will assume that you have returned to your regular daily activities without incident.  If any biopsies were taken you will be contacted by phone or by letter within the next 1-3 weeks.  Please call us at [phone] if you have not heard about the biopsies in 3 weeks.    SIGNATURES/CONFIDENTIALITY: You and/or your care partner have signed paperwork which will be entered into your electronic medical record.  These signatures attest to the fact that that the information above on your After Visit Summary has been reviewed and is understood.  Full responsibility of the confidentiality of this discharge information lies with you and/or your care-partner.    Handouts were given to your care partner on polyps, diverticulosis, and a high fiber diet with liberal fluid intake. You may resume  current medications today. Await biopsy results. Please call if any questions or concerns.   

## 2015-07-03 NOTE — Progress Notes (Signed)
Called to room to assist during endoscopic procedure.  Patient ID and intended procedure confirmed with present staff. Received instructions for my participation in the procedure from the performing physician.  

## 2015-07-03 NOTE — Progress Notes (Signed)
No problems noted in the recovery room. maw 

## 2015-07-06 ENCOUNTER — Telehealth: Payer: Self-pay | Admitting: *Deleted

## 2015-07-06 NOTE — Telephone Encounter (Signed)
  Follow up Call-  Call back number 07/03/2015  Post procedure Call Back phone  # [phone]  Permission to leave phone message Yes     Patient questions:  Do you have a fever, pain , or abdominal swelling? No. Pain Score  0 *  Have you tolerated food without any problems? Yes.    Have you been able to return to your normal activities? Yes.    Do you have any questions about your discharge instructions: Diet   No. Medications  No. Follow up visit  No.  Do you have questions or concerns about your Care? No.  Actions: * If pain score is 4 or above: No action needed, pain <4.

## 2015-07-07 ENCOUNTER — Encounter: Payer: Self-pay | Admitting: Gastroenterology

## 2015-08-10 ENCOUNTER — Ambulatory Visit: Payer: Self-pay | Admitting: Internal Medicine

## 2015-08-14 ENCOUNTER — Ambulatory Visit: Payer: Self-pay | Admitting: Internal Medicine

## 2015-08-24 ENCOUNTER — Encounter: Payer: Self-pay | Admitting: Gastroenterology

## 2015-09-03 ENCOUNTER — Encounter: Payer: Self-pay | Admitting: Physician Assistant

## 2015-09-03 ENCOUNTER — Ambulatory Visit (INDEPENDENT_AMBULATORY_CARE_PROVIDER_SITE_OTHER): Payer: 59 | Admitting: Physician Assistant

## 2015-09-03 VITALS — BP 120/74 | HR 56 | Temp 97.9°F | Resp 14 | Ht 72.25 in | Wt 187.0 lb

## 2015-09-03 DIAGNOSIS — J0100 Acute maxillary sinusitis, unspecified: Secondary | ICD-10-CM | POA: Diagnosis not present

## 2015-09-03 MED ORDER — PREDNISONE 20 MG PO TABS: ORAL_TABLET | ORAL | Status: DC

## 2015-09-03 MED ORDER — AZITHROMYCIN 250 MG PO TABS: ORAL_TABLET | ORAL | Status: AC

## 2015-09-03 NOTE — Progress Notes (Signed)
Subjective:    Patient ID: Tyler Gilbert, male    DOB: [birth year], 69 y.o.   MRN: [id number]  HPI 69 y.o. WM with history of HTN, chol, hypothyroid presents with cold symptoms x 10 days. Drove back from [place] and started with sinus congestion, cough nonproductive, sore throat, no fever or chills at this time, he has had SOB, wheezing and some chest discomfort with coughing only. Denies swelling in legs, palpitations. Has tried OTC cold meds, allergy pill.   Blood pressure 120/74, pulse 56, temperature 97.9 F (36.6 C), temperature source Temporal, resp. rate 14, height 6' 0.25" (1.835 m), weight 187 lb (84.823 kg), SpO2 98 %.  Past Medical History  Diagnosis Date  . Labile hypertension   . Hyperlipidemia   . Prediabetes   . Hypothyroid   . Vitamin D deficiency    Current Outpatient Prescriptions on File Prior to Visit  Medication Sig Dispense Refill  . aspirin 325 MG tablet Take 150 mg by mouth every other day. Taking 1/2    . Cholecalciferol (VITAMIN D PO) Take 6,000 Int'l Units by mouth daily.    . fenofibrate 160 MG tablet Take 1 tablet by mouth  daily 90 tablet 1  . levothyroxine (SYNTHROID, LEVOTHROID) 100 MCG tablet Take 1 tablet daily or as directed on an empty stomach for 30-60 minutes (Patient taking differently: Take 50 mcg by mouth. Take 1 tablet daily or as directed on an empty stomach for 30-60 minutes) 90 tablet 99  . MAGNESIUM PO Take 400 mg by mouth daily.     . Multiple Vitamins-Minerals (MULTIVITAMIN PO) Take by mouth daily.    . Omega-3 Fatty Acids (FISH OIL PO) Take by mouth daily.    . rosuvastatin (CRESTOR) 40 MG tablet TAKE 1 TABLET EVERY DAY FOR CHOLESTEROL (Patient taking differently: Take 20 mg by mouth daily. TAKE 1 TABLET EVERY DAY FOR CHOLESTEROL) 90 tablet 1   No current facility-administered medications on file prior to visit.     Review of Systems  Constitutional: Negative for fever, chills and diaphoresis.  HENT: Positive for congestion, sinus  pressure, sneezing and sore throat. Negative for ear pain, trouble swallowing and voice change.   Eyes: Negative.   Respiratory: Positive for cough, chest tightness and shortness of breath. Negative for wheezing.   Cardiovascular: Negative.   Gastrointestinal: Negative.   Genitourinary: Negative.   Musculoskeletal: Negative for neck pain.  Neurological: Negative.  Negative for headaches.       Objective:   Physical Exam  Constitutional: He is oriented to person, place, and time. He appears well-developed and well-nourished.  HENT:  Head: Normocephalic and atraumatic.  Right Ear: Hearing and tympanic membrane normal.  Left Ear: Hearing and tympanic membrane normal.  Nose: Right sinus exhibits maxillary sinus tenderness. Left sinus exhibits maxillary sinus tenderness.  Mouth/Throat: Uvula is midline and mucous membranes are normal. Posterior oropharyngeal erythema present. No oropharyngeal exudate, posterior oropharyngeal edema or tonsillar abscesses.  Eyes: Conjunctivae are normal. Pupils are equal, round, and reactive to light.  Neck: Normal range of motion. Neck supple.  Cardiovascular: Normal rate and regular rhythm.   Pulmonary/Chest: Effort normal and breath sounds normal.  Abdominal: Soft. Bowel sounds are normal.  Musculoskeletal: Normal range of motion.  Lymphadenopathy:    He has no cervical adenopathy.  Neurological: He is alert and oriented to person, place, and time.  Skin: Skin is warm and dry. No rash noted.       Assessment & Plan:  1. Acute  maxillary sinusitis, recurrence not specified - predniSONE (DELTASONE) 20 MG tablet; 2 tablets daily for 3 days, 1 tablet daily for 4 days.  Dispense: 10 tablet; Refill: 0 - azithromycin (ZITHROMAX) 250 MG tablet; Take 2 tablets (500 mg) on  Day 1,  followed by 1 tablet (250 mg) once daily on Days 2 through 5.  Dispense: 6 each; Refill: 1

## 2015-09-03 NOTE — Patient Instructions (Signed)
Sinusitis can be uncomfortable. People with sinusitis have congestion with yellow/green/gray discharge, sinus pain/pressure, pain around the eyes. Sinus infections almost ALWAYS stem from a viral infection and antibiotics don't work against a virus. Even when bacteria is responsible, the infections usually clear up on their own in a week or so.   PLEASE TRY TO DO OVER THE COUNTER TREATMENT AND PREDNISONE FOR 5-7 DAYS AND IF YOU ARE NOT GETTING BETTER OR GETTING WORSE THEN YOU CAN START ON AN ANTIBIOTIC GIVEN.  Can take the prednisone AT NIGHT WITH DINNER, it take 8-12 hours to start working so it will NOT affect your sleeping if you take it at night with your food!! Take two pills the first night and 1 or two pill the second night and then 1 pill the other nights.   Risk of antibiotic use: About 1 in 4 people who take antibiotics have side effects including stomach problems, dizziness, or rashes. Those problems clear up soon after stopping the drugs, but in rare cases antibiotics can cause severe allergic reaction. Over use of antibiotics also encourages the growth of bacteria that can't be controlled easily with drugs. That makes you more vunerable to antibiotic-resistant infections and undermines the benefits of antibiotics for others.   Waste of Money: Antibiotics often aren't very expensive, but any money spent on unnecessary drugs is money down the drain.   When are antibiotics needed? Only when symptoms last longer than a week.  Start to improve but then worsen again  -It can take up to 2 weeks to feel better.   -If you do not get better in 7-10 days (Have fever, facial pain, dental pain and swelling), then please call the office and it is now appropriate to start an antibiotic.   -Please take Tylenol or Ibuprofen for pain. -Acetaminiphen 325mg orally every 4-6 hours for pain.  Max: 10 per day -Ibuprofen 200mg orally every 6-8 hours for pain.  Take with food to avoid ulcers.   Max 10 per  day  Please pick one of the over the counter allergy medications below and take it once daily for allergies.  Claritin or loratadine cheapest but likely the weakest  Zyrtec or certizine at night because it can make you sleepy The strongest is allegra or fexafinadine  Cheapest at [facility], [employer], [employer]  -While drinking fluids, pinch and hold nose close and swallow.  This will help open up your eustachian tubes to drain the fluid behind your ear drums. -Try steam showers to open your nasal passages.   Drink lots of water to stay hydrated and to thin mucous.  Flonase/Nasonex is to help the inflammation.  Take 2 sprays in each nostril at bedtime.  Make sure you spray towards the outside of each nostril towards the outer corner of your eye, hold nose close and tilt head back.  This will help the medication get into your sinuses.  If you do not like this medication, then use saline nasal sprays same directions as above for Flonase. Stop the medication right away if you get blurring of your vision or nose bleeds.  Sinusitis Sinusitis is redness, soreness, and inflammation of the paranasal sinuses. Paranasal sinuses are air pockets within the bones of your face (beneath the eyes, the middle of the forehead, or above the eyes). In healthy paranasal sinuses, mucus is able to drain out, and air is able to circulate through them by way of your nose. However, when your paranasal sinuses are inflamed, mucus and air can   become trapped. This can allow bacteria and other germs to grow and cause infection. Sinusitis can develop quickly and last only a short time (acute) or continue over a long period (chronic). Sinusitis that lasts for more than 12 weeks is considered chronic.  CAUSES  Causes of sinusitis include: Allergies. Structural abnormalities, such as displacement of the cartilage that separates your nostrils (deviated septum), which can decrease the air flow through your nose and sinuses and affect sinus  drainage. Functional abnormalities, such as when the small hairs (cilia) that line your sinuses and help remove mucus do not work properly or are not present. SIGNS AND SYMPTOMS  Symptoms of acute and chronic sinusitis are the same. The primary symptoms are pain and pressure around the affected sinuses. Other symptoms include: Upper toothache. Earache. Headache. Bad breath. Decreased sense of smell and taste. A cough, which worsens when you are lying flat. Fatigue. Fever. Thick drainage from your nose, which often is green and may contain pus (purulent). Swelling and warmth over the affected sinuses. DIAGNOSIS  Your health care provider will perform a physical exam. During the exam, your health care provider may: Look in your nose for signs of abnormal growths in your nostrils (nasal polyps).  Tap over the affected sinus to check for signs of infection. View the inside of your sinuses (endoscopy) using an imaging device that has a light attached (endoscope). If your health care provider suspects that you have chronic sinusitis, one or more of the following tests may be recommended: Allergy tests. Nasal culture. A sample of mucus is taken from your nose, sent to a lab, and screened for bacteria. Nasal cytology. A sample of mucus is taken from your nose and examined by your health care provider to determine if your sinusitis is related to an allergy. TREATMENT  Most cases of acute sinusitis are related to a viral infection and will resolve on their own within 10 days. Sometimes medicines are prescribed to help relieve symptoms (pain medicine, decongestants, nasal steroid sprays, or saline sprays).  However, for sinusitis related to a bacterial infection, your health care provider will prescribe antibiotic medicines. These are medicines that will help kill the bacteria causing the infection.  Rarely, sinusitis is caused by a fungal infection. In theses cases, your health care provider will  prescribe antifungal medicine. For some cases of chronic sinusitis, surgery is needed. Generally, these are cases in which sinusitis recurs more than 3 times per year, despite other treatments. HOME CARE INSTRUCTIONS  Drink plenty of water. Water helps thin the mucus so your sinuses can drain more easily. Use a humidifier. Inhale steam 3 to 4 times a day (for example, sit in the bathroom with the shower running). Apply a warm, moist washcloth to your face 3 to 4 times a day, or as directed by your health care provider. Use saline nasal sprays to help moisten and clean your sinuses. Take medicines only as directed by your health care provider. If you were prescribed either an antibiotic or antifungal medicine, finish it all even if you start to feel better. SEEK IMMEDIATE MEDICAL CARE IF: You have increasing pain or severe headaches. You have nausea, vomiting, or drowsiness. You have swelling around your face. You have vision problems. You have a stiff neck. You have difficulty breathing. MAKE SURE YOU:  Understand these instructions. Will watch your condition. Will get help right away if you are not doing well or get worse. Document Released: 11/07/2005 Document Revised: 03/24/2014 Document Reviewed: 11/22/2011 [employer]   Patient Information 2015 [employer]. This information is not intended to replace advice given to you by your health care provider. Make sure you discuss any questions you have with your health care provider.   

## 2015-10-16 ENCOUNTER — Other Ambulatory Visit: Payer: Self-pay | Admitting: Internal Medicine

## 2015-11-19 ENCOUNTER — Encounter: Payer: Self-pay | Admitting: Internal Medicine

## 2015-11-19 ENCOUNTER — Ambulatory Visit (INDEPENDENT_AMBULATORY_CARE_PROVIDER_SITE_OTHER): Payer: 59 | Admitting: Internal Medicine

## 2015-11-19 VITALS — BP 118/72 | HR 60 | Temp 97.9°F | Resp 16 | Ht 72.25 in | Wt 184.2 lb

## 2015-11-19 DIAGNOSIS — Z79899 Other long term (current) drug therapy: Secondary | ICD-10-CM

## 2015-11-19 DIAGNOSIS — E785 Hyperlipidemia, unspecified: Secondary | ICD-10-CM

## 2015-11-19 DIAGNOSIS — I10 Essential (primary) hypertension: Secondary | ICD-10-CM

## 2015-11-19 DIAGNOSIS — R7303 Prediabetes: Secondary | ICD-10-CM | POA: Diagnosis not present

## 2015-11-19 DIAGNOSIS — E039 Hypothyroidism, unspecified: Secondary | ICD-10-CM

## 2015-11-19 DIAGNOSIS — R0989 Other specified symptoms and signs involving the circulatory and respiratory systems: Secondary | ICD-10-CM

## 2015-11-19 DIAGNOSIS — E559 Vitamin D deficiency, unspecified: Secondary | ICD-10-CM | POA: Diagnosis not present

## 2015-11-19 LAB — CBC WITH DIFFERENTIAL/PLATELET
BASOS ABS: 0.1 10*3/uL (ref 0.0–0.1)
Basophils Relative: 1 % (ref 0–1)
EOS ABS: 0.1 10*3/uL (ref 0.0–0.7)
Eosinophils Relative: 2 % (ref 0–5)
HCT: 43.4 % (ref 39.0–52.0)
Hemoglobin: 15 g/dL (ref 13.0–17.0)
Lymphocytes Relative: 27 % (ref 12–46)
Lymphs Abs: 1.8 10*3/uL (ref 0.7–4.0)
MCH: 30.5 pg (ref 26.0–34.0)
MCHC: 34.6 g/dL (ref 30.0–36.0)
MCV: 88.4 fL (ref 78.0–100.0)
MPV: 10 fL (ref 8.6–12.4)
Monocytes Absolute: 0.7 10*3/uL (ref 0.1–1.0)
Monocytes Relative: 11 % (ref 3–12)
NEUTROS ABS: 3.9 10*3/uL (ref 1.7–7.7)
NEUTROS PCT: 59 % (ref 43–77)
PLATELETS: 259 10*3/uL (ref 150–400)
RBC: 4.91 MIL/uL (ref 4.22–5.81)
RDW: 14.1 % (ref 11.5–15.5)
WBC: 6.6 10*3/uL (ref 4.0–10.5)

## 2015-11-19 LAB — BASIC METABOLIC PANEL WITH GFR
BUN: 21 mg/dL (ref 7–25)
CO2: 24 mmol/L (ref 20–31)
CREATININE: 1.66 mg/dL — AB (ref 0.70–1.25)
Calcium: 10.1 mg/dL (ref 8.6–10.3)
Chloride: 103 mmol/L (ref 98–110)
GFR, EST NON AFRICAN AMERICAN: 42 mL/min — AB (ref >=60)
GFR, Est African American: 48 mL/min — ABNORMAL LOW (ref >=60)
Glucose, Bld: 99 mg/dL (ref 65–99)
Potassium: 4.3 mmol/L (ref 3.5–5.3)
SODIUM: 141 mmol/L (ref 135–146)

## 2015-11-19 LAB — LIPID PANEL
CHOL/HDL RATIO: 3.7 ratio (ref <=5.0)
CHOLESTEROL: 161 mg/dL (ref 125–200)
HDL: 44 mg/dL (ref >=40)
LDL CALC: 96 mg/dL (ref <130)
TRIGLYCERIDES: 104 mg/dL (ref <150)
VLDL: 21 mg/dL (ref <30)

## 2015-11-19 LAB — HEPATIC FUNCTION PANEL
ALK PHOS: 45 U/L (ref 40–115)
ALT: 27 U/L (ref 9–46)
AST: 32 U/L (ref 10–35)
Albumin: 4.6 g/dL (ref 3.6–5.1)
BILIRUBIN DIRECT: 0.1 mg/dL (ref <=0.2)
BILIRUBIN INDIRECT: 0.3 mg/dL (ref 0.2–1.2)
BILIRUBIN TOTAL: 0.4 mg/dL (ref 0.2–1.2)
Total Protein: 7.3 g/dL (ref 6.1–8.1)

## 2015-11-19 LAB — TSH: TSH: 2.894 u[IU]/mL (ref 0.350–4.500)

## 2015-11-19 LAB — MAGNESIUM: Magnesium: 2.1 mg/dL (ref 1.5–2.5)

## 2015-11-19 LAB — HEMOGLOBIN A1C
Hgb A1c MFr Bld: 5.9 % — ABNORMAL HIGH (ref <5.7)
Mean Plasma Glucose: 123 mg/dL — ABNORMAL HIGH (ref <117)

## 2015-11-19 NOTE — Progress Notes (Signed)
Patient ID: Tyler Gilbert, male   DOB: [birth year], 69 y.o.   MRN: [id number]     This very nice 69 y.o. MWM presents for 3 month follow up with Hypertension, Hyperlipidemia, Pre-Diabetes, Hypothyroidism  and Vitamin D Deficiency.      Patient is treated for HTN since 2008 & BP has been controlled at home. Today's BP: 118/72 mmHg. Patient has had no complaints of any cardiac type chest pain, palpitations, dyspnea/orthopnea/PND, dizziness, claudication, or dependent edema.     Hyperlipidemia is controlled with diet & meds. Patient denies myalgias or other med SE's. Last Lipids were at goal with  Cholesterol 163; HDL 44; LDL 93; Triglycerides 131 on 05/04/2015.      Also, the patient has history of PreDiabetes since 2011 with A1c 5.8% and later 6.1% in 2014 and has had no symptoms of reactive hypoglycemia, diabetic polys, paresthesias or visual blurring.  Last A1c was 6.0% on  05/04/2015.     Patient has been on Thyroid replacement circa 2008. Further, the patient also has history of Vitamin D Deficiency of 30 in 2008 and supplements vitamin D without any suspected side-effects. Last vitamin D was 55 on 05/04/2015.  Medication Sig  . aspirin 325 MG tablet Take 150 mg by mouth every other day. Taking 1/2  . VITAMIN D Take 6,000 Int'l Units by mouth daily.  . fenofibrate 160 MG tablet Take 1 tablet by mouth  daily  . levothyroxine  100 MCG tablet Take 1 tablet daily or as directed   . MAGNESIUM PO Take 400 mg by mouth daily.   . Multiple Vitamins-Minerals  Take by mouth daily.  . Omega-3 FISH OIL Take by mouth daily.  . rosuvastatin 40 MG  TAKE 1 TABLET EVERY DAY FOR CHOLESTEROL   Allergies  Allergen Reactions  . Prednisolone     High doses make her "nervous"   PMHx:   Past Medical History  Diagnosis Date  . Labile hypertension   . Hyperlipidemia   . Prediabetes   . Hypothyroid   . Vitamin D deficiency    Immunization History  Administered Date(s) Administered  . PPD Test 02/06/2014,  05/04/2015  . Pneumococcal Polysaccharide-23 01/26/2012  . Tdap 12/04/2008  . Zoster 11/07/2013   Past Surgical History  Procedure Laterality Date  . Orthopedic surgery Left 1991    left disc in [place]  . Other surgical history      Tonsillectomy bilateral at age 69  . Lumbar disc surgery  1991 and 1995    S1 and L4  . Colonoscopy  2010  . Polypectomy  2010   FHx:    Reviewed / unchanged  SHx:    Reviewed / unchanged  Systems Review:  Constitutional: Denies fever, chills, wt changes, headaches, insomnia, fatigue, night sweats, change in appetite. Eyes: Denies redness, blurred vision, diplopia, discharge, itchy, watery eyes.  ENT: Denies discharge, congestion, post nasal drip, epistaxis, sore throat, earache, hearing loss, dental pain, tinnitus, vertigo, sinus pain, snoring.  CV: Denies chest pain, palpitations, irregular heartbeat, syncope, dyspnea, diaphoresis, orthopnea, PND, claudication or edema. Respiratory: denies cough, dyspnea, DOE, pleurisy, hoarseness, laryngitis, wheezing.  Gastrointestinal: Denies dysphagia, odynophagia, heartburn, reflux, water brash, abdominal pain or cramps, nausea, vomiting, bloating, diarrhea, constipation, hematemesis, melena, hematochezia  or hemorrhoids. Genitourinary: Denies dysuria, frequency, urgency, nocturia, hesitancy, discharge, hematuria or flank pain. Musculoskeletal: Denies arthralgias, myalgias, stiffness, jt. swelling, pain, limping or strain/sprain.  Skin: Denies pruritus, rash, hives, warts, acne, eczema or change in skin lesion(s). Neuro: No weakness,  tremor, incoordination, spasms, paresthesia or pain. Psychiatric: Denies confusion, memory loss or sensory loss. Endo: Denies change in weight, skin or hair change.  Heme/Lymph: No excessive bleeding, bruising or enlarged lymph nodes.  Physical Exam  BP 118/72 mmHg  Pulse 60  Temp(Src) 97.9 F (36.6 C)  Resp 16  Ht 6' 0.25" (1.835 m)  Wt 184 lb 3.2 oz (83.553 kg)  BMI 24.81  kg/m2  Appears well nourished and in no distress. Eyes: PERRLA, EOMs, conjunctiva no swelling or erythema. Sinuses: No frontal/maxillary tenderness ENT/Mouth: EAC's clear, TM's nl w/o erythema, bulging. Nares clear w/o erythema, swelling, exudates. Oropharynx clear without erythema or exudates. Oral hygiene is good. Tongue normal, non obstructing. Hearing intact.  Neck: Supple. Thyroid nl. Car 2+/2+ without bruits, nodes or JVD. Chest: Respirations nl with BS clear & equal w/o rales, rhonchi, wheezing or stridor.  Cor: Heart sounds normal w/ regular rate and rhythm without sig. murmurs, gallops, clicks, or rubs. Peripheral pulses normal and equal  without edema.  Abdomen: Soft & bowel sounds normal. Non-tender w/o guarding, rebound, hernias, masses, or organomegaly.  Lymphatics: Unremarkable.  Musculoskeletal: Full ROM all peripheral extremities, joint stability, 5/5 strength, and normal gait.  Skin: Warm, dry without exposed rashes, lesions or ecchymosis apparent.  Neuro: Cranial nerves intact, reflexes equal bilaterally. Sensory-motor testing grossly intact. Tendon reflexes grossly intact.  Pysch: Alert & oriented x 3.  Insight and judgement nl & appropriate. No ideations.  Assessment and Plan:  1. Labile hypertension  - TSH  2. Hyperlipidemia  - Lipid panel - TSH  3. Prediabetes  - Hemoglobin A1c - Insulin, random  4. Vitamin D deficiency  - VITAMIN D 25 Hydroxy  5. Hypothyroidism   6. Medication management  - CBC with Differential/Platelet - BASIC METABOLIC PANEL WITH GFR - Hepatic function panel - Magnesium   Recommended regular exercise, BP monitoring, weight control, and discussed med and SE's. Recommended labs to assess and monitor clinical status. Further disposition pending results of labs. Over 30 minutes of exam, counseling, chart review was performed

## 2015-11-19 NOTE — Patient Instructions (Signed)

## 2015-11-20 LAB — INSULIN, RANDOM: Insulin: 13.6 u[IU]/mL (ref 2.0–19.6)

## 2015-11-20 LAB — VITAMIN D 25 HYDROXY (VIT D DEFICIENCY, FRACTURES): Vit D, 25-Hydroxy: 69 ng/mL (ref 30–100)

## 2016-01-28 ENCOUNTER — Other Ambulatory Visit: Payer: Self-pay | Admitting: Internal Medicine

## 2016-02-25 ENCOUNTER — Ambulatory Visit (INDEPENDENT_AMBULATORY_CARE_PROVIDER_SITE_OTHER): Payer: 59 | Admitting: Physician Assistant

## 2016-02-25 VITALS — BP 128/68 | HR 67 | Temp 97.7°F | Resp 16 | Ht 72.25 in | Wt 184.0 lb

## 2016-02-25 DIAGNOSIS — E785 Hyperlipidemia, unspecified: Secondary | ICD-10-CM

## 2016-02-25 DIAGNOSIS — E039 Hypothyroidism, unspecified: Secondary | ICD-10-CM | POA: Diagnosis not present

## 2016-02-25 DIAGNOSIS — Z79899 Other long term (current) drug therapy: Secondary | ICD-10-CM

## 2016-02-25 DIAGNOSIS — J0410 Acute tracheitis without obstruction: Secondary | ICD-10-CM | POA: Diagnosis not present

## 2016-02-25 DIAGNOSIS — E559 Vitamin D deficiency, unspecified: Secondary | ICD-10-CM | POA: Diagnosis not present

## 2016-02-25 DIAGNOSIS — R0989 Other specified symptoms and signs involving the circulatory and respiratory systems: Secondary | ICD-10-CM

## 2016-02-25 DIAGNOSIS — I10 Essential (primary) hypertension: Secondary | ICD-10-CM

## 2016-02-25 DIAGNOSIS — R7303 Prediabetes: Secondary | ICD-10-CM

## 2016-02-25 LAB — CBC WITH DIFFERENTIAL/PLATELET
Basophils Absolute: 70 cells/uL (ref 0–200)
Basophils Relative: 1 %
EOS ABS: 210 {cells}/uL (ref 15–500)
Eosinophils Relative: 3 %
HEMATOCRIT: 44.1 % (ref 38.5–50.0)
HEMOGLOBIN: 14.8 g/dL (ref 13.2–17.1)
LYMPHS PCT: 32 %
Lymphs Abs: 2240 cells/uL (ref 850–3900)
MCH: 30.1 pg (ref 27.0–33.0)
MCHC: 33.6 g/dL (ref 32.0–36.0)
MCV: 89.6 fL (ref 80.0–100.0)
MONO ABS: 560 {cells}/uL (ref 200–950)
MPV: 10.2 fL (ref 7.5–12.5)
Monocytes Relative: 8 %
NEUTROS PCT: 56 %
Neutro Abs: 3920 cells/uL (ref 1500–7800)
Platelets: 281 10*3/uL (ref 140–400)
RBC: 4.92 MIL/uL (ref 4.20–5.80)
RDW: 13.4 % (ref 11.0–15.0)
WBC: 7 10*3/uL (ref 3.8–10.8)

## 2016-02-25 MED ORDER — PREDNISONE 10 MG PO TABS: ORAL_TABLET | ORAL | Status: DC

## 2016-02-25 NOTE — Progress Notes (Signed)
Assessment and Plan:  Hypertension: Continue medication, monitor blood pressure at home. Continue DASH diet.  Reminder to go to the ER if any CP, SOB, nausea, dizziness, severe HA, changes vision/speech, left arm numbness and tingling, and jaw pain. Cholesterol: Continue diet and exercise. Check cholesterol.  Pre-diabetes-Continue diet and exercise. Check A1C Vitamin D Def- check level and continue medications. Tracheitis/GERD- low dose prednisone and PPI, if not better ENT  Continue diet and meds as discussed. Further disposition pending results of labs.  HPI 70 y.o. male  presents for 3 month follow up with hypertension, hyperlipidemia, prediabetes and vitamin D. His blood pressure has been controlled at home, today their BP is BP: 128/68 mmHg He does workout. He denies chest pain, shortness of breath, dizziness.  For past 6-9 months has been having hoarseness getting progressively worse. 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 Nothing makes it worse, has not tried anything for it. No dysphagia, no trouble swallowing, no tightness, denies sinus drainage symptoms. Has history of reflux, never a smoker.  He is on cholesterol medication and denies myalgias. His cholesterol is at goal. The cholesterol last visit was:   Lab Results  Component Value Date   CHOL 161 11/19/2015   HDL 44 11/19/2015   LDLCALC 96 11/19/2015   TRIG 104 11/19/2015   CHOLHDL 3.7 11/19/2015   He has been working on diet and exercise for prediabetes, he is down 9 lbs with diet, and denies paresthesia of the feet, polydipsia and polyuria. Last A1C in the office was:  Lab Results  Component Value Date   HGBA1C 5.9* 11/19/2015   Patient is on Vitamin D supplement.   Lab Results  Component Value Date   VD25OH 69 11/19/2015     He is on thyroid medication. His medication was not changed last visit. Patient denies heat / cold intolerance, nervousness and palpitations.  Lab Results   Component Value Date   TSH 2.894 11/19/2015  .   Current Medications:  Current Outpatient Prescriptions on File Prior to Visit  Medication Sig Dispense Refill  . aspirin 325 MG tablet Take 150 mg by mouth every other day. Taking 1/2    . Cholecalciferol (VITAMIN D PO) Take 6,000 Int'l Units by mouth daily.    . fenofibrate 160 MG tablet Take 1 tablet by mouth  daily 90 tablet 1  . levothyroxine (SYNTHROID, LEVOTHROID) 100 MCG tablet Take 1 tablet daily or as directed on an empty stomach for 30-60 minutes (Patient taking differently: Take 50 mcg by mouth. Take 1 tablet daily or as directed on an empty stomach for 30-60 minutes) 90 tablet 99  . MAGNESIUM PO Take 400 mg by mouth daily.     . Multiple Vitamins-Minerals (MULTIVITAMIN PO) Take by mouth daily.    . Omega-3 Fatty Acids (FISH OIL PO) Take by mouth daily.    . rosuvastatin (CRESTOR) 40 MG tablet TAKE 1 TABLET EVERY DAY FOR CHOLESTEROL 90 tablet 1   No current facility-administered medications on file prior to visit.   Medical History:  Past Medical History  Diagnosis Date  . Labile hypertension   . Hyperlipidemia   . Prediabetes   . Hypothyroid   . Vitamin D deficiency    Allergies:  Allergies  Allergen Reactions  . Prednisolone     High doses make her "nervous"    Review of Systems  Constitutional: Negative.   HENT: Negative.        Hoarseness  Eyes:  Negative.   Respiratory: Negative.   Cardiovascular: Negative.   Gastrointestinal: Positive for heartburn. Negative for nausea, vomiting, abdominal pain, diarrhea, constipation, blood in stool and melena.  Genitourinary: Negative.   Musculoskeletal: Negative.   Skin: Negative.   Neurological: Negative.   Endo/Heme/Allergies: Negative.   Psychiatric/Behavioral: Negative.     Family history- Review and unchanged Social history- Review and unchanged Physical Exam: BP 128/68 mmHg  Pulse 67  Temp(Src) 97.7 F (36.5 C) (Temporal)  Resp 16  Ht 6' 0.25" (1.835  m)  Wt 184 lb (83.462 kg)  BMI 24.79 kg/m2  SpO2 97% Wt Readings from Last 3 Encounters:  02/25/16 184 lb (83.462 kg)  11/19/15 184 lb 3.2 oz (83.553 kg)  09/03/15 187 lb (84.823 kg)   General Appearance: Well nourished, in no apparent distress. Eyes: PERRLA, EOMs, conjunctiva no swelling or erythema Sinuses: No Frontal/maxillary tenderness ENT/Mouth: Ext aud canals clear, TMs without erythema, bulging. No erythema, swelling, or exudate on post pharynx.  Tonsils not swollen or erythematous. Hearing normal.  Neck: Supple, thyroid normal.  Respiratory: Respiratory effort normal, BS equal bilaterally without rales, rhonchi, wheezing or stridor.  Cardio: RRR with no MRGs. Brisk peripheral pulses without edema.  Abdomen: Soft, + BS.  Non tender, no guarding, rebound, hernias, masses. Lymphatics: Non tender without lymphadenopathy.  Musculoskeletal: Full ROM, 5/5 strength, normal gait. , full ROM, good cap refill.  Skin: Warm, dry without rashes, lesions, ecchymosis.  Neuro: Cranial nerves intact. Normal muscle tone, no cerebellar symptoms. Sensation intact.  Psych: Awake and oriented X 3, normal affect, Insight and Judgment appropriate.    Vicie Mutters, PA-C 3:32 PM [facility]

## 2016-02-25 NOTE — Patient Instructions (Signed)
Hoarseness Hoarseness is any abnormal change in your voice.Hoarseness can make it difficult to speak. Your voice may sound raspy, breathy, or strained. Hoarseness is caused by a problem with the vocal cords. The vocal cords are two bands of tissue inside your voice box (larynx). When you speak, your vocal cords move back and forth to create sound. The surfaces of your vocal cords need to be smooth for your voice to sound clear. Swelling or lumps on the vocal cords can cause hoarseness. Common causes of vocal cord problems include:  Upper airway infection.  A long-term cough.  Straining or overusing your voice.  Smoking.  Allergies.  Vocal cord growths.  Stomach acids that flow up from your stomach and irritate your vocal cords (gastroesophageal reflux). HOME CARE INSTRUCTIONS Watch your condition for any changes. To ease any discomfort that you feel:  Rest your voice. Do not whisper. Whispering can cause muscle strain.  Do not speak in a loud or harsh voice that makes your hoarseness worse.  Do not use any tobacco products, including cigarettes, chewing tobacco, or electronic cigarettes. If you need help quitting, ask your health care provider.  Avoid secondhand smoke.  Do not eat foods that give you heartburn. Heartburn can make gastroesophageal reflux worse.  Do not drink coffee.  Do not drink alcohol.  Drink enough fluids to keep your urine clear or pale yellow.  Use a humidifier if the air in your home is dry. SEEK MEDICAL CARE IF:  You have hoarseness that lasts longer than 3 weeks.  You almost lose or completelylose your voice for longer than 3 days.  You have pain when you swallow or try to talk.  You feel a lump in your neck. SEEK IMMEDIATE MEDICAL CARE IF:  You have trouble swallowing.  You feel as though you are choking when you swallow.  You cough up blood or vomit blood.  You have trouble breathing.   This information is not intended to replace  advice given to you by your health care provider. Make sure you discuss any questions you have with your health care provider.   Document Released: 10/21/2005 Document Revised: 03/24/2015 Document Reviewed: 10/29/2014 Elsevier Interactive Patient Education 2016 [employer].   Common causes of cough OR hoarseness OR sore throat:   Allergies, Viral Infections, Acid Reflux and Bacterial Infections.  1) Allergies and viral infections cause a cough OR sore throat by post nasal drip and are often worse at night, can also have sneezing, lower grade fevers, clear/yellow mucus. This is best treated with allergy medications or nasal sprays.  Please get on allegra for 1-2 weeks The strongest is allegra or fexafinadine  Cheapest at [facility], [employer], [employer]  2) Bacterial infections are more severe than allergies or viral infections with fever, teeth pain, fatigue. This can be treated with prednisone and the same over the counter medication and after 7 days can be treated with an antibiotic.   3) Silent reflux/GERD can cause a cough OR sore throat OR hoarseness WITHOUT heart burn because the esophagus that goes to the stomach and trachea that goes to the lungs are very close and when you lay down the acid can irritate your throat and lungs. This can cause hoarseness, cough, and wheezing. Please stop any alcohol or anti-inflammatories like aleve/advil/ibuprofen and start an over the counter Prilosec or omeprazole 1-2 times daily 20mins before food for 2 weeks, then switch to over the counter zantac/ratinidine or pepcid/famotadine once at night for 2 weeks.  4) sometimes irritation causes more irritation. Try voice rest, use sugar free cough drops to prevent coughing, and try to stop clearing your throat.   If you ever have a cough that does not go away after trying these things please make a follow up visit for further evaluation or we can refer you to a specialist. Or if you ever have shortness of breath or  chest pain go to the ER.

## 2016-02-26 LAB — HEPATIC FUNCTION PANEL
ALK PHOS: 45 U/L (ref 40–115)
ALT: 22 U/L (ref 9–46)
AST: 26 U/L (ref 10–35)
Albumin: 4.4 g/dL (ref 3.6–5.1)
BILIRUBIN DIRECT: 0.1 mg/dL (ref <=0.2)
BILIRUBIN INDIRECT: 0.3 mg/dL (ref 0.2–1.2)
Total Bilirubin: 0.4 mg/dL (ref 0.2–1.2)
Total Protein: 7.3 g/dL (ref 6.1–8.1)

## 2016-02-26 LAB — HEMOGLOBIN A1C
HEMOGLOBIN A1C: 5.9 % — AB (ref <5.7)
MEAN PLASMA GLUCOSE: 123 mg/dL

## 2016-02-26 LAB — MAGNESIUM: Magnesium: 2 mg/dL (ref 1.5–2.5)

## 2016-02-26 LAB — LIPID PANEL
Cholesterol: 164 mg/dL (ref 125–200)
HDL: 41 mg/dL (ref >=40)
LDL Cholesterol: 93 mg/dL (ref <130)
Total CHOL/HDL Ratio: 4 Ratio (ref <=5.0)
Triglycerides: 152 mg/dL — ABNORMAL HIGH (ref <150)
VLDL: 30 mg/dL (ref <30)

## 2016-02-26 LAB — BASIC METABOLIC PANEL WITH GFR
BUN: 19 mg/dL (ref 7–25)
CALCIUM: 9.7 mg/dL (ref 8.6–10.3)
CO2: 25 mmol/L (ref 20–31)
Chloride: 102 mmol/L (ref 98–110)
Creat: 1.51 mg/dL — ABNORMAL HIGH (ref 0.70–1.25)
GFR, EST AFRICAN AMERICAN: 54 mL/min — AB (ref >=60)
GFR, Est Non African American: 46 mL/min — ABNORMAL LOW (ref >=60)
GLUCOSE: 80 mg/dL (ref 65–99)
POTASSIUM: 4.2 mmol/L (ref 3.5–5.3)
Sodium: 140 mmol/L (ref 135–146)

## 2016-02-26 LAB — VITAMIN D 25 HYDROXY (VIT D DEFICIENCY, FRACTURES): Vit D, 25-Hydroxy: 59 ng/mL (ref 30–100)

## 2016-02-26 LAB — TSH: TSH: 3.8 m[IU]/L (ref 0.40–4.50)

## 2016-03-31 ENCOUNTER — Other Ambulatory Visit: Payer: Self-pay | Admitting: Internal Medicine

## 2016-05-26 ENCOUNTER — Encounter: Payer: Self-pay | Admitting: Internal Medicine

## 2016-06-04 ENCOUNTER — Other Ambulatory Visit: Payer: Self-pay | Admitting: Internal Medicine

## 2016-06-13 ENCOUNTER — Encounter: Payer: Self-pay | Admitting: Internal Medicine

## 2016-08-06 ENCOUNTER — Other Ambulatory Visit: Payer: Self-pay | Admitting: Internal Medicine

## 2016-09-08 ENCOUNTER — Ambulatory Visit (INDEPENDENT_AMBULATORY_CARE_PROVIDER_SITE_OTHER): Payer: 59 | Admitting: Internal Medicine

## 2016-09-08 ENCOUNTER — Other Ambulatory Visit: Payer: Self-pay | Admitting: Internal Medicine

## 2016-09-08 ENCOUNTER — Encounter: Payer: Self-pay | Admitting: Internal Medicine

## 2016-09-08 VITALS — BP 114/76 | HR 68 | Temp 97.8°F | Resp 16 | Ht 71.5 in | Wt 182.6 lb

## 2016-09-08 DIAGNOSIS — Z0000 Encounter for general adult medical examination without abnormal findings: Secondary | ICD-10-CM | POA: Diagnosis not present

## 2016-09-08 DIAGNOSIS — Z1212 Encounter for screening for malignant neoplasm of rectum: Secondary | ICD-10-CM

## 2016-09-08 DIAGNOSIS — Z136 Encounter for screening for cardiovascular disorders: Secondary | ICD-10-CM | POA: Diagnosis not present

## 2016-09-08 DIAGNOSIS — R972 Elevated prostate specific antigen [PSA]: Secondary | ICD-10-CM | POA: Insufficient documentation

## 2016-09-08 DIAGNOSIS — R5383 Other fatigue: Secondary | ICD-10-CM

## 2016-09-08 DIAGNOSIS — E782 Mixed hyperlipidemia: Secondary | ICD-10-CM

## 2016-09-08 DIAGNOSIS — I10 Essential (primary) hypertension: Secondary | ICD-10-CM | POA: Diagnosis not present

## 2016-09-08 DIAGNOSIS — Z125 Encounter for screening for malignant neoplasm of prostate: Secondary | ICD-10-CM

## 2016-09-08 DIAGNOSIS — Z0001 Encounter for general adult medical examination with abnormal findings: Secondary | ICD-10-CM

## 2016-09-08 DIAGNOSIS — E559 Vitamin D deficiency, unspecified: Secondary | ICD-10-CM

## 2016-09-08 DIAGNOSIS — R0989 Other specified symptoms and signs involving the circulatory and respiratory systems: Secondary | ICD-10-CM

## 2016-09-08 DIAGNOSIS — N390 Urinary tract infection, site not specified: Secondary | ICD-10-CM

## 2016-09-08 DIAGNOSIS — Z79899 Other long term (current) drug therapy: Secondary | ICD-10-CM

## 2016-09-08 DIAGNOSIS — R399 Unspecified symptoms and signs involving the genitourinary system: Secondary | ICD-10-CM

## 2016-09-08 DIAGNOSIS — R7303 Prediabetes: Secondary | ICD-10-CM

## 2016-09-08 MED ORDER — TAMSULOSIN HCL 0.4 MG PO CAPS: ORAL_CAPSULE | ORAL | 1 refills | Status: AC

## 2016-09-08 MED ORDER — TAMSULOSIN HCL 0.4 MG PO CAPS: ORAL_CAPSULE | ORAL | 1 refills | Status: DC

## 2016-09-08 NOTE — Progress Notes (Signed)
[facility]   Unk Pinto, M.D.    Uvaldo Bristle. Silverio Lay, P.A.-C      Starlyn Skeans, P.A.-C  [facility]                [address] [place] [id number] Telephone [phone] Telefax [phone] Annual  Screening/Preventative Visit  & Comprehensive Evaluation & Examination     This very nice 70 y.o. MWM presents for a Screening/Preventative Visit & comprehensive evaluation and management of multiple medical co-morbidities.  Patient has been followed for HTN, T2_NIDDM  Prediabetes, Hyperlipidemia and Vitamin D Deficiency. Patient has remote hx/o elevated PSA up to 9 and had negative prostate bx's in 2006. Recently he reports's increased LUTS's with Nocturia x 3-4, and hesitancy with slow stream and sensation of incomplete emptying. He also c/o intermittent dysuria.      HTN predates circa 2008. Patient's BP has been controlled at home.Today's BP is  114/76. Patient denies any cardiac symptoms as chest pain, palpitations, shortness of breath, dizziness or ankle swelling.     Patient's hyperlipidemia is controlled with diet and medications. Patient denies myalgias or other medication SE's. Last lipids were at goal:  Lab Results  Component Value Date   CHOL 164 02/25/2016   HDL 41 02/25/2016   LDLCALC 93 02/25/2016   TRIG 152 (H) 02/25/2016   CHOLHDL 4.0 02/25/2016      Patient has prediabetes with A1c 5.8% circa 2012 and 6.1% in 2014.  He denies reactive hypoglycemic symptoms, visual blurring, diabetic polys or paresthesias. Last A1c was still not at goal: Lab Results  Component Value Date   HGBA1C 5.9 (H) 02/25/2016       Patient has been on Thyroid replacement since 2012.  Finally, patient has history of Vitamin D Deficiency in 2008 of "30" and last vitamin D was at goal:  Lab Results  Component Value Date   VD25OH 59 02/25/2016   Current Outpatient Prescriptions on File Prior to Visit   Medication Sig  . aspirin 325 MG  Taking 1/2 every other day  . VITAMIN D 6,000 Int'l Units Take   daily.  . fenofibrate 160 MG tablet Take 1 tab  daily  . levothyroxine  100 MCG tab TAKE 1 TAB EVERY DAY  - takes 1/2 tab  . MAGNESIUM 400 mg  Take  daily.   . Multi-Vit w/Min Take  daily.  . Omega-3 FISH OIL Take  daily.  . rosuvastatin  40 MG tablet Take 1 tab  every day - takes 1/2 tab    Allergies  Allergen Reactions  . Prednisolone     High doses make her "nervous"   Past Medical History:  Diagnosis Date  . Hyperlipidemia   . Hypothyroid   . Labile hypertension   . Prediabetes   . Vitamin D deficiency    Health Maintenance  Topic Date Due  . Hepatitis C Screening  May 18, 1946  . PNA vac Low Risk Adult (1 of 2 - PCV13) 01/25/2013  . INFLUENZA VACCINE  06/21/2016  . TETANUS/TDAP  12/04/2018  . COLONOSCOPY  07/02/2020  . ZOSTAVAX  Completed   Immunization History  Administered Date(s) Administered  . Influenza-Unspecified 08/31/2016  . PPD Test 02/06/2014, 05/04/2015  . Pneumococcal Polysaccharide-23 01/26/2012  . Tdap 12/04/2008  . Zoster 11/07/2013   Past Surgical History:  Procedure Laterality Date  . COLONOSCOPY  2010  .  [place] and 1995   S1 and L4  . ORTHOPEDIC SURGERY Left 1991   left disc in [place]  . OTHER SURGICAL HISTORY     Tonsillectomy bilateral at age 5  . POLYPECTOMY  2010   Family History  Problem Relation Age of Onset  . Diverticulosis Mother   . Cancer Mother   . Lymphoma Mother   . Heart disease Father   . Heart attack Father   . Colitis Brother   . Esophageal cancer Neg Hx   . Rectal cancer Neg Hx   . Stomach cancer Neg Hx   . Colon cancer Maternal Grandmother    Social History   Social History  . Marital status: Married    Spouse name:   . Number of children: 1 daughter    Occupational History  .    Social History Main Topics  . Smoking status: Never Smoker  . Smokeless tobacco: Never Used  . Alcohol use  No  . Drug use: No  . Sexual activity: Active    ROS Constitutional: Denies fever, chills, weight loss/gain, headaches, insomnia,  night sweats or change in appetite. Does c/o fatigue. Eyes: Denies redness, blurred vision, diplopia, discharge, itchy or watery eyes.  ENT: Denies discharge, congestion, post nasal drip, epistaxis, sore throat, earache, hearing loss, dental pain, Tinnitus, Vertigo, Sinus pain or snoring.  Cardio: Denies chest pain, palpitations, irregular heartbeat, syncope, dyspnea, diaphoresis, orthopnea, PND, claudication or edema Respiratory: denies cough, dyspnea, DOE, pleurisy, hoarseness, laryngitis or wheezing.  Gastrointestinal: Denies dysphagia, heartburn, reflux, water brash, pain, cramps, nausea, vomiting, bloating, diarrhea, constipation, hematemesis, melena, hematochezia, jaundice or hemorrhoids Genitourinary: Denies dysuria, frequency, urgency, nocturia, hesitancy, discharge, hematuria or flank pain Musculoskeletal: Denies arthralgia, myalgia, stiffness, Jt. Swelling, pain, limp or strain/sprain. Denies Falls. Skin: Denies puritis, rash, hives, warts, acne, eczema or change in skin lesion Neuro: No weakness, tremor, incoordination, spasms, paresthesia or pain Psychiatric: Denies confusion, memory loss or sensory loss. Denies Depression. Endocrine: Denies change in weight, skin, hair change, nocturia, and paresthesia, diabetic polys, visual blurring or hyper / hypo glycemic episodes.  Heme/Lymph: No excessive bleeding, bruising or enlarged lymph nodes.  Physical Exam  BP 114/76   Pulse 68   Temp 97.8 F (36.6 C)   Resp 16   Ht 5' 11.5" (1.816 m)   Wt 182 lb 9.6 oz (82.8 kg)   BMI 25.11 kg/m   General Appearance: Well nourished, in no apparent distress.  Eyes: PERRLA, EOMs, conjunctiva no swelling or erythema, normal fundi and vessels. Sinuses: No frontal/maxillary tenderness ENT/Mouth: EACs patent / TMs  nl. Nares clear without erythema, swelling, mucoid  exudates. Oral hygiene is good. No erythema, swelling, or exudate. Tongue normal, non-obstructing. Tonsils not swollen or erythematous. Hearing normal.  Neck: Supple, thyroid normal. No bruits, nodes or JVD. Respiratory: Respiratory effort normal.  BS equal and clear bilateral without rales, rhonci, wheezing or stridor. Cardio: Heart sounds are normal with regular rate and rhythm and no murmurs, rubs or gallops. Peripheral pulses are normal and equal bilaterally without edema. No aortic or femoral bruits. Chest: symmetric with normal excursions and percussion.  Abdomen: Soft, with Nl bowel sounds. Nontender, no guarding, rebound, hernias, masses, or organomegaly.  Lymphatics: Non tender without lymphadenopathy.  Genitourinary:  Deferred by patient as he has annual DRE in 2-3 months scheduled with Dr Risa Grill.  Musculoskeletal: Full ROM all peripheral extremities, joint stability, 5/5 strength, and normal gait. Skin: Warm and dry without rashes, lesions, cyanosis, clubbing or  ecchymosis.  Neuro: Cranial nerves intact, reflexes equal bilaterally. Normal muscle tone, no cerebellar symptoms. Sensation intact.  Pysch: Alert and oriented X 3 with normal affect, insight and judgment appropriate.   Assessment and Plan  1. Annual Preventative/Screening Exam   - Microalbumin / creatinine urine ratio - EKG 12-Lead - [place], RETROPERITNL ABD,  LTD - POC Hemoccult Bld/Stl  - Urinalysis, Routine w reflex microscopic  - Vitamin B12 - Iron and TIBC - Testosterone - CBC with Differential/Platelet - BASIC METABOLIC PANEL WITH GFR - Hepatic function panel - Magnesium - Lipid panel - TSH - Hemoglobin A1c - Insulin, random - VITAMIN D 25 Hydroxy   2. Labile hypertension  - Microalbumin / creatinine urine ratio - EKG 12-Lead - [place], RETROPERITNL ABD,  LTD - TSH  3. Mixed hyperlipidemia  - EKG 12-Lead - [place], RETROPERITNL ABD,  LTD - Lipid panel - TSH  4. Prediabetes  - EKG 12-Lead - [place],  RETROPERITNL ABD,  LTD - Hemoglobin A1c - Insulin, random  5. Vitamin D deficiency  - VITAMIN D 25 Hydroxy  6. Screening for rectal cancer  - POC Hemoccult Bld/Stl   7. Prostate cancer screening  -  PSA, Total & Free  8. Screening for ischemic heart disease  - EKG 12-Lead  9. Screening for AAA (aortic abdominal aneurysm)  - [place], RETROPERITNL ABD,  LTD  10. Elevated PSA  - PSA, Total and Free  11. Urinary tract infection   - Urine culture  12. Lower urinary tract symptoms (LUTS)  - tamsulosin (FLOMAX) 0.4 MG CAPS capsule; Take 1 capsule at night for prostate  Dispense: 90 capsule; Refill: 1 - discussed with patient to consider adding Finasteride at some future date and encouraged to discuss with Dr Risa Grill.   13. Other fatigue  - Vitamin B12 - Iron and TIBC - Testosterone - CBC with Differential/Platelet - TSH  14. Medication management  - Urinalysis, Routine w reflex microscopic - CBC with Differential/Platelet - BASIC METABOLIC PANEL WITH GFR - Hepatic function panel - Magnesium       Continue prudent diet as discussed, weight control, BP monitoring, regular exercise, and medications as discussed.  Discussed med effects and SE's. Routine screening labs and tests as requested with regular follow-up as recommended. Over 40 minutes of exam, counseling, chart review and high complex critical decision making was performed

## 2016-09-08 NOTE — Patient Instructions (Signed)

## 2016-09-09 LAB — PSA, TOTAL AND FREE
PSA, % Free: 45 % (ref >25)
PSA, Free: 3.4 ng/mL
PSA, Total: 7.5 ng/mL — ABNORMAL HIGH (ref <=4.0)

## 2016-09-09 LAB — LIPID PANEL
Cholesterol: 154 mg/dL (ref 125–200)
HDL: 39 mg/dL — ABNORMAL LOW (ref >=40)
LDL Cholesterol: 75 mg/dL (ref <130)
Total CHOL/HDL Ratio: 3.9 Ratio (ref <=5.0)
Triglycerides: 199 mg/dL — ABNORMAL HIGH (ref <150)
VLDL: 40 mg/dL — ABNORMAL HIGH (ref <30)

## 2016-09-09 LAB — TSH: TSH: 4.1 mIU/L (ref 0.40–4.50)

## 2016-09-09 LAB — HEPATIC FUNCTION PANEL
ALBUMIN: 4.4 g/dL (ref 3.6–5.1)
ALK PHOS: 42 U/L (ref 40–115)
ALT: 20 U/L (ref 9–46)
AST: 26 U/L (ref 10–35)
BILIRUBIN DIRECT: 0.1 mg/dL (ref <=0.2)
BILIRUBIN TOTAL: 0.5 mg/dL (ref 0.2–1.2)
Indirect Bilirubin: 0.4 mg/dL (ref 0.2–1.2)
Total Protein: 7 g/dL (ref 6.1–8.1)

## 2016-09-09 LAB — URINALYSIS, ROUTINE W REFLEX MICROSCOPIC
Bilirubin Urine: NEGATIVE
Glucose, UA: NEGATIVE
Hgb urine dipstick: NEGATIVE
Ketones, ur: NEGATIVE
Leukocytes, UA: NEGATIVE
Nitrite: NEGATIVE
Protein, ur: NEGATIVE
Specific Gravity, Urine: 1.019 (ref 1.001–1.035)
pH: 7 (ref 5.0–8.0)

## 2016-09-09 LAB — BASIC METABOLIC PANEL WITH GFR
BUN: 24 mg/dL (ref 7–25)
CO2: 26 mmol/L (ref 20–31)
Calcium: 10.2 mg/dL (ref 8.6–10.3)
Chloride: 102 mmol/L (ref 98–110)
Creat: 1.66 mg/dL — ABNORMAL HIGH (ref 0.70–1.25)
GFR, EST AFRICAN AMERICAN: 48 mL/min — AB (ref >=60)
GFR, Est Non African American: 41 mL/min — ABNORMAL LOW (ref >=60)
Glucose, Bld: 88 mg/dL (ref 65–99)
POTASSIUM: 4.6 mmol/L (ref 3.5–5.3)
SODIUM: 141 mmol/L (ref 135–146)

## 2016-09-09 LAB — MICROALBUMIN / CREATININE URINE RATIO
Creatinine, Urine: 192 mg/dL (ref 20–370)
Microalb Creat Ratio: 4 mcg/mg creat (ref <30)
Microalb, Ur: 0.8 mg/dL

## 2016-09-09 LAB — CBC WITH DIFFERENTIAL/PLATELET
Basophils Absolute: 0 cells/uL (ref 0–200)
Basophils Relative: 0 %
Eosinophils Absolute: 186 cells/uL (ref 15–500)
Eosinophils Relative: 2 %
HCT: 42.6 % (ref 38.5–50.0)
Hemoglobin: 14.4 g/dL (ref 13.2–17.1)
Lymphocytes Relative: 23 %
Lymphs Abs: 2139 cells/uL (ref 850–3900)
MCH: 30.4 pg (ref 27.0–33.0)
MCHC: 33.8 g/dL (ref 32.0–36.0)
MCV: 89.9 fL (ref 80.0–100.0)
MPV: 10 fL (ref 7.5–12.5)
Monocytes Absolute: 837 cells/uL (ref 200–950)
Monocytes Relative: 9 %
Neutro Abs: 6138 cells/uL (ref 1500–7800)
Neutrophils Relative %: 66 %
Platelets: 264 10*3/uL (ref 140–400)
RBC: 4.74 MIL/uL (ref 4.20–5.80)
RDW: 13.3 % (ref 11.0–15.0)
WBC: 9.3 10*3/uL (ref 3.8–10.8)

## 2016-09-09 LAB — MAGNESIUM: Magnesium: 1.8 mg/dL (ref 1.5–2.5)

## 2016-09-09 LAB — VITAMIN B12: Vitamin B-12: 401 pg/mL (ref 200–1100)

## 2016-09-09 LAB — HEMOGLOBIN A1C
Hgb A1c MFr Bld: 5.7 % — ABNORMAL HIGH (ref <5.7)
Mean Plasma Glucose: 117 mg/dL

## 2016-09-09 LAB — IRON AND TIBC
%SAT: 19 % (ref 15–60)
Iron: 86 ug/dL (ref 50–180)
TIBC: 445 ug/dL — AB (ref 250–425)
UIBC: 359 ug/dL (ref 125–400)

## 2016-09-09 LAB — VITAMIN D 25 HYDROXY (VIT D DEFICIENCY, FRACTURES): Vit D, 25-Hydroxy: 55 ng/mL (ref 30–100)

## 2016-09-09 LAB — TESTOSTERONE: TESTOSTERONE: 249 ng/dL — AB (ref 250–827)

## 2016-09-09 LAB — INSULIN, RANDOM: Insulin: 24.4 u[IU]/mL — ABNORMAL HIGH (ref 2.0–19.6)

## 2016-09-10 ENCOUNTER — Other Ambulatory Visit: Payer: Self-pay | Admitting: Internal Medicine

## 2016-09-10 LAB — URINE CULTURE: ORGANISM ID, BACTERIA: NO GROWTH

## 2016-09-14 ENCOUNTER — Other Ambulatory Visit: Payer: Self-pay | Admitting: Internal Medicine

## 2016-09-20 ENCOUNTER — Encounter: Payer: Self-pay | Admitting: Internal Medicine

## 2016-11-19 ENCOUNTER — Other Ambulatory Visit: Payer: Self-pay | Admitting: Internal Medicine

## 2016-12-12 ENCOUNTER — Ambulatory Visit (INDEPENDENT_AMBULATORY_CARE_PROVIDER_SITE_OTHER): Payer: 59 | Admitting: Internal Medicine

## 2016-12-12 ENCOUNTER — Encounter: Payer: Self-pay | Admitting: Internal Medicine

## 2016-12-12 VITALS — BP 110/72 | HR 66 | Temp 97.3°F | Resp 14 | Ht 71.0 in | Wt 185.0 lb

## 2016-12-12 DIAGNOSIS — R7303 Prediabetes: Secondary | ICD-10-CM

## 2016-12-12 DIAGNOSIS — Z79899 Other long term (current) drug therapy: Secondary | ICD-10-CM | POA: Diagnosis not present

## 2016-12-12 DIAGNOSIS — E039 Hypothyroidism, unspecified: Secondary | ICD-10-CM

## 2016-12-12 DIAGNOSIS — I10 Essential (primary) hypertension: Secondary | ICD-10-CM

## 2016-12-12 DIAGNOSIS — E559 Vitamin D deficiency, unspecified: Secondary | ICD-10-CM

## 2016-12-12 DIAGNOSIS — E782 Mixed hyperlipidemia: Secondary | ICD-10-CM | POA: Diagnosis not present

## 2016-12-12 DIAGNOSIS — R0989 Other specified symptoms and signs involving the circulatory and respiratory systems: Secondary | ICD-10-CM

## 2016-12-12 LAB — CBC WITH DIFFERENTIAL/PLATELET
BASOS PCT: 1 %
Basophils Absolute: 73 cells/uL (ref 0–200)
EOS ABS: 146 {cells}/uL (ref 15–500)
Eosinophils Relative: 2 %
HEMATOCRIT: 44.5 % (ref 38.5–50.0)
HEMOGLOBIN: 14.9 g/dL (ref 13.2–17.1)
LYMPHS ABS: 1898 {cells}/uL (ref 850–3900)
LYMPHS PCT: 26 %
MCH: 30.3 pg (ref 27.0–33.0)
MCHC: 33.5 g/dL (ref 32.0–36.0)
MCV: 90.6 fL (ref 80.0–100.0)
MONO ABS: 949 {cells}/uL (ref 200–950)
MPV: 9.9 fL (ref 7.5–12.5)
Monocytes Relative: 13 %
NEUTROS PCT: 58 %
Neutro Abs: 4234 cells/uL (ref 1500–7800)
Platelets: 273 10*3/uL (ref 140–400)
RBC: 4.91 MIL/uL (ref 4.20–5.80)
RDW: 13.3 % (ref 11.0–15.0)
WBC: 7.3 10*3/uL (ref 3.8–10.8)

## 2016-12-12 LAB — TSH: TSH: 2.68 m[IU]/L (ref 0.40–4.50)

## 2016-12-12 NOTE — Progress Notes (Signed)
Assessment and Plan:  Hypertension:  -Continue medication,  -monitor blood pressure at home.  -Continue DASH diet.   -Reminder to go to the ER if any CP, SOB, nausea, dizziness, severe HA, changes vision/speech, left arm numbness and tingling, and jaw pain.  Cholesterol: -drop to 1/2 tablet of statin 3 days per week -good control -cont fenofibrate -Continue diet and exercise.  -Check cholesterol.   Pre-diabetes: -Continue diet and exercise.  -Check A1C  Vitamin D Def: -check level -continue medications.   Hypothyroidism -cont levothyroxine -TSH -go to taking on empty stomach  Continue diet and meds as discussed. Further disposition pending results of labs.  HPI 71 y.o. male  presents for 3 month follow up with hypertension, hyperlipidemia, prediabetes and vitamin D.   His blood pressure has been controlled at home, today their BP is BP: 110/72.   He does workout. He denies chest pain, shortness of breath, dizziness.  He is going to the gym strive 4-5 days per week.  He is doing resistance training and also some cardio work.     He is on cholesterol medication and denies myalgias. His cholesterol is at goal. The cholesterol last visit was:   Lab Results  Component Value Date   CHOL 154 09/08/2016   HDL 39 (L) 09/08/2016   LDLCALC 75 09/08/2016   TRIG 199 (H) 09/08/2016   CHOLHDL 3.9 09/08/2016     He has been working on diet and exercise for prediabetes, and denies foot ulcerations, hyperglycemia, hypoglycemia , increased appetite, nausea, paresthesia of the feet, polydipsia, polyuria, visual disturbances, vomiting and weight loss. Last A1C in the office was:  Lab Results  Component Value Date   HGBA1C 5.7 (H) 09/08/2016    Patient is on Vitamin D supplement.  Lab Results  Component Value Date   VD25OH 66 09/08/2016      He reports that he is taking thyroid medication with his other medications and also with food.    Current Medications:  Current Outpatient  Prescriptions on File Prior to Visit  Medication Sig Dispense Refill  . aspirin 325 MG tablet Take 150 mg by mouth every other day. Taking 1/2    . Cholecalciferol (VITAMIN D PO) Take 6,000 Int'l Units by mouth daily.    . fenofibrate 160 MG tablet TAKE 1 TABLET BY MOUTH  DAILY 90 tablet 1  . levothyroxine (SYNTHROID, LEVOTHROID) 100 MCG tablet TAKE 1 TABLET BY MOUTH EVERY DAY ON AN EMPTY STOMACH FOR 30-60 MINUTES 90 tablet 1  . MAGNESIUM PO Take 400 mg by mouth daily.     . Multiple Vitamins-Minerals (MULTIVITAMIN PO) Take by mouth daily.    . Omega-3 Fatty Acids (FISH OIL PO) Take by mouth daily.    . rosuvastatin (CRESTOR) 40 MG tablet Take 1 tablet by mouth  every day for cholesterol 90 tablet 1  . tamsulosin (FLOMAX) 0.4 MG CAPS capsule Take 1 capsule at night for prostate 90 capsule 1   No current facility-administered medications on file prior to visit.     Medical History:  Past Medical History:  Diagnosis Date  . Hyperlipidemia   . Hypothyroid   . Labile hypertension   . Prediabetes   . Vitamin D deficiency     Allergies:  Allergies  Allergen Reactions  . Prednisolone     High doses make her "nervous"     Review of Systems:  Review of Systems  Constitutional: Negative for chills, fever and malaise/fatigue.  HENT: Negative for congestion, ear pain  and sore throat.   Eyes: Negative.   Respiratory: Negative for cough, shortness of breath and wheezing.   Cardiovascular: Negative for chest pain, palpitations and leg swelling.  Gastrointestinal: Negative for abdominal pain, blood in stool, constipation, diarrhea, heartburn and melena.  Genitourinary: Negative.   Skin: Negative.   Neurological: Negative for dizziness, sensory change, loss of consciousness and headaches.  Psychiatric/Behavioral: Negative for depression. The patient is not nervous/anxious and does not have insomnia.     Family history- Review and unchanged  Social history- Review and  unchanged  Physical Exam: BP 110/72   Pulse 66   Temp 97.3 F (36.3 C)   Resp 14   Ht 5\' 11"  (1.803 m)   Wt 185 lb (83.9 kg)   SpO2 95%   BMI 25.80 kg/m  Wt Readings from Last 3 Encounters:  12/12/16 185 lb (83.9 kg)  09/08/16 182 lb 9.6 oz (82.8 kg)  02/25/16 184 lb (83.5 kg)    General Appearance: Well nourished well developed, in no apparent distress. Eyes: PERRLA, EOMs, conjunctiva no swelling or erythema ENT/Mouth: Ear canals normal without obstruction, swelling, erythma, discharge.  TMs normal bilaterally.  Oropharynx moist, clear, without exudate, or postoropharyngeal swelling. Neck: Supple, thyroid normal,no cervical adenopathy  Respiratory: Respiratory effort normal, Breath sounds clear A&P without rhonchi, wheeze, or rale.  No retractions, no accessory usage. Cardio: RRR with no MRGs. Brisk peripheral pulses without edema.  Abdomen: Soft, + BS,  Non tender, no guarding, rebound, hernias, masses. Musculoskeletal: Full ROM, 5/5 strength, Normal gait Skin: Warm, dry without rashes, lesions, ecchymosis.  Neuro: Awake and oriented X 3, Cranial nerves intact. Normal muscle tone, no cerebellar symptoms. Psych: Normal affect, Insight and Judgment appropriate.    Starlyn Skeans, PA-C 4:58 PM [facility]

## 2016-12-13 LAB — BASIC METABOLIC PANEL WITH GFR
BUN: 23 mg/dL (ref 7–25)
CO2: 25 mmol/L (ref 20–31)
Calcium: 9.8 mg/dL (ref 8.6–10.3)
Chloride: 106 mmol/L (ref 98–110)
Creat: 1.66 mg/dL — ABNORMAL HIGH (ref 0.70–1.25)
GFR, Est African American: 48 mL/min — ABNORMAL LOW (ref >=60)
GFR, Est Non African American: 41 mL/min — ABNORMAL LOW (ref >=60)
GLUCOSE: 110 mg/dL — AB (ref 65–99)
Potassium: 3.8 mmol/L (ref 3.5–5.3)
Sodium: 142 mmol/L (ref 135–146)

## 2016-12-13 LAB — HEPATIC FUNCTION PANEL
ALBUMIN: 4.5 g/dL (ref 3.6–5.1)
ALK PHOS: 48 U/L (ref 40–115)
ALT: 20 U/L (ref 9–46)
AST: 23 U/L (ref 10–35)
BILIRUBIN INDIRECT: 0.2 mg/dL (ref 0.2–1.2)
Bilirubin, Direct: 0.1 mg/dL (ref <=0.2)
TOTAL PROTEIN: 7.1 g/dL (ref 6.1–8.1)
Total Bilirubin: 0.3 mg/dL (ref 0.2–1.2)

## 2016-12-13 LAB — LIPID PANEL
Cholesterol: 157 mg/dL (ref <200)
HDL: 36 mg/dL — ABNORMAL LOW (ref >40)
LDL CALC: 75 mg/dL (ref <100)
Total CHOL/HDL Ratio: 4.4 Ratio (ref <5.0)
Triglycerides: 228 mg/dL — ABNORMAL HIGH (ref <150)
VLDL: 46 mg/dL — ABNORMAL HIGH (ref <30)

## 2017-02-24 ENCOUNTER — Other Ambulatory Visit: Payer: Self-pay | Admitting: Internal Medicine

## 2017-03-12 NOTE — Patient Instructions (Signed)

## 2017-03-12 NOTE — Progress Notes (Signed)
This very nice  MWM presents for 6 month follow up with Hypertension, Hyperlipidemia, Pre-Diabetes and Vitamin D Deficiency. Patient also has Obstructive prostatism w/LUTS and was started on Flomax 6 months ago which he reports he uses sporadically.      Patient has been monitored expectantly  for labile HTN (2008) & BP has been controlled at home and today's BP is at goal - 122/80. Patient has had no complaints of any cardiac type chest pain, palpitations, dyspnea/orthopnea/PND, dizziness, claudication, or dependent edema.     Hyperlipidemia is controlled with diet & meds. Patient denies myalgias or other med SE's. Last Lipids were to gaol albeit sl elevated slightly elevated Trig's: Lab Results  Component Value Date   CHOL 157 12/12/2016   HDL 36 (L) 12/12/2016   LDLCALC 75 12/12/2016   TRIG 228 (H) 12/12/2016   CHOLHDL 4.4 12/12/2016      Patient was started on Thyroid Replacement in 2008. Also, the patient has history of PreDiabetes (A1c 5.8% in 2012 and 6.1% in 2014)and has had no symptoms of reactive hypoglycemia, diabetic polys, paresthesias or visual blurring.  Last A1c was almost to goal: Lab Results  Component Value Date   HGBA1C 5.7 (H) 09/08/2016      Further, the patient also has history of Vitamin D Deficiency ("30" in 2008) and supplements vitamin D without any suspected side-effects. Last vitamin D was at goal:  Lab Results  Component Value Date   VD25OH 55 09/08/2016   Current Outpatient Prescriptions on File Prior to Visit  Medication Sig  . aspirin 325 MG Take 150 mg by mouth every other day. Taking 1/2  . VITAMIN D Take 6,000 Int'l Units daily.  . fenofibrate 160 MG  TAKE 1 TAB DAILY  . levothyroxine  100 MCG TAKE 1 TAB EVERY DAY   . MAGNESIUM PO Take 400 mg by mouth daily.   . MultiVit-Min -ZINC Take daily  . Omega-3 FISH OIL  Take  daily.  . rosuvastatin  40 MG Take 1 tab  every day    Allergies  . Prednisolone     High doses make her "nervous"    PMHx:   Past Medical History:  Diagnosis Date  . Hyperlipidemia   . Hypothyroid   . Labile hypertension   . Prediabetes   . Vitamin D deficiency    Immunization History  Administered Date(s) Administered  . Influenza-Unspecified 08/31/2016  . PPD Test 02/06/2014, 05/04/2015  . Pneumococcal Polysaccharide-23 01/26/2012  . Tdap 12/04/2008  . Zoster 11/07/2013   Past Surgical History:  Procedure Laterality Date  . COLONOSCOPY  2010  . [place] and 1995   S1 and L4  . ORTHOPEDIC SURGERY Left 1991   left disc in [place]  . OTHER SURGICAL HISTORY     Tonsillectomy bilateral at age 21  . POLYPECTOMY  2010   FHx:    Reviewed / unchanged  SHx:    Reviewed / unchanged  Systems Review:  Constitutional: Denies fever, chills, wt changes, headaches, insomnia, fatigue, night sweats, change in appetite. Eyes: Denies redness, blurred vision, diplopia, discharge, itchy, watery eyes.  ENT: Denies discharge, congestion, post nasal drip, epistaxis, sore throat, earache, hearing loss, dental pain, tinnitus, vertigo, sinus pain, snoring.  CV: Denies chest pain, palpitations, irregular heartbeat, syncope, dyspnea, diaphoresis, orthopnea, PND, claudication or edema. Respiratory: denies cough, dyspnea, DOE, pleurisy, hoarseness, laryngitis, wheezing.  Gastrointestinal: Denies dysphagia, odynophagia, heartburn, reflux, water brash, abdominal pain or cramps, nausea,  vomiting, bloating, diarrhea, constipation, hematemesis, melena, hematochezia  or hemorrhoids. Genitourinary: Denies dysuria, frequency, urgency, nocturia, hesitancy, discharge, hematuria or flank pain. Musculoskeletal: Denies arthralgias, myalgias, stiffness, jt. swelling, pain, limping or strain/sprain.  Skin: Denies pruritus, rash, hives, warts, acne, eczema or change in skin lesion(s). Neuro: No weakness, tremor, incoordination, spasms, paresthesia or pain. Psychiatric: Denies confusion, memory loss or sensory  loss. Endo: Denies change in weight, skin or hair change.  Heme/Lymph: No excessive bleeding, bruising or enlarged lymph nodes.  Physical Exam  BP 122/80   Pulse 60   Temp 97.3 F (36.3 C)   Resp 16   Ht 5' 11.5" (1.816 m)   Wt 182 lb 6.4 oz (82.7 kg)   BMI 25.09 kg/m   Appears well nourished, well groomed  and in no distress.  Eyes: PERRLA, EOMs, conjunctiva no swelling or erythema. Sinuses: No frontal/maxillary tenderness ENT/Mouth: EAC's clear, TM's nl w/o erythema, bulging. Nares clear w/o erythema, swelling, exudates. Oropharynx clear without erythema or exudates. Oral hygiene is good. Tongue normal, non obstructing. Hearing intact.  Neck: Supple. Thyroid nl. Car 2+/2+ without bruits, nodes or JVD. Chest: Respirations nl with BS clear & equal w/o rales, rhonchi, wheezing or stridor.  Cor: Heart sounds normal w/ regular rate and rhythm without sig. murmurs, gallops, clicks or rubs. Peripheral pulses normal and equal  without edema.  Abdomen: Soft & bowel sounds normal. Non-tender w/o guarding, rebound, hernias, masses or organomegaly.  Lymphatics: Unremarkable.  Musculoskeletal: Full ROM all peripheral extremities, joint stability, 5/5 strength and normal gait.  Skin: Warm, dry without exposed rashes, lesions or ecchymosis apparent.  Neuro: Cranial nerves intact, reflexes equal bilaterally. Sensory-motor testing grossly intact. Tendon reflexes grossly intact.  Pysch: Alert & oriented x 3.  Insight and judgement nl & appropriate. No ideations.  Assessment and Plan:   1. Labile hypertension  - Continue medication, monitor blood pressure at home.  - Continue DASH diet. Reminder to go to the ER if any CP,  SOB, nausea, dizziness, severe HA, changes vision/speech,  left arm numbness and tingling and jaw pain.  - CBC with Differential/Platelet - BASIC METABOLIC PANEL WITH GFR - Magnesium - TSH  2. Mixed hyperlipidemia  - Continue diet/meds, exercise,& lifestyle  modifications.  - Continue monitor periodic cholesterol/liver & renal functions   - Hepatic function panel - Lipid panel - TSH  3. Prediabetes  - Continue diet, exercise, lifestyle modifications.  - Monitor appropriate labs.   - Hemoglobin A1c - Insulin, random  4. Vitamin D deficiency  - Continue supplementation.  - VITAMIN D 25 Hydroxy   5. Hypothyroidism   6. Medication management  - CBC with Differential/Platelet - BASIC METABOLIC PANEL WITH GFR - Hepatic function panel - Magnesium - Lipid panel - TSH - Hemoglobin A1c - Insulin, random - VITAMIN D 25 Hydroxy        Discussed  regular exercise, BP monitoring, weight control to achieve/maintain BMI less than 25 and discussed med and SE's. Recommended labs to assess and monitor clinical status with further disposition pending results of labs. Over 30 minutes of exam, counseling, chart review was performed.

## 2017-03-13 ENCOUNTER — Ambulatory Visit (INDEPENDENT_AMBULATORY_CARE_PROVIDER_SITE_OTHER): Payer: 59 | Admitting: Internal Medicine

## 2017-03-13 ENCOUNTER — Encounter: Payer: Self-pay | Admitting: Internal Medicine

## 2017-03-13 VITALS — BP 122/80 | HR 60 | Temp 97.3°F | Resp 16 | Ht 71.5 in | Wt 182.4 lb

## 2017-03-13 DIAGNOSIS — N138 Other obstructive and reflux uropathy: Secondary | ICD-10-CM | POA: Diagnosis not present

## 2017-03-13 DIAGNOSIS — E782 Mixed hyperlipidemia: Secondary | ICD-10-CM | POA: Diagnosis not present

## 2017-03-13 DIAGNOSIS — E039 Hypothyroidism, unspecified: Secondary | ICD-10-CM | POA: Diagnosis not present

## 2017-03-13 DIAGNOSIS — Z79899 Other long term (current) drug therapy: Secondary | ICD-10-CM

## 2017-03-13 DIAGNOSIS — E559 Vitamin D deficiency, unspecified: Secondary | ICD-10-CM | POA: Diagnosis not present

## 2017-03-13 DIAGNOSIS — N401 Enlarged prostate with lower urinary tract symptoms: Secondary | ICD-10-CM

## 2017-03-13 DIAGNOSIS — R7303 Prediabetes: Secondary | ICD-10-CM | POA: Diagnosis not present

## 2017-03-13 DIAGNOSIS — R0989 Other specified symptoms and signs involving the circulatory and respiratory systems: Secondary | ICD-10-CM | POA: Diagnosis not present

## 2017-03-13 LAB — BASIC METABOLIC PANEL WITH GFR
BUN: 30 mg/dL — ABNORMAL HIGH (ref 7–25)
CALCIUM: 9.8 mg/dL (ref 8.6–10.3)
CO2: 26 mmol/L (ref 20–31)
CREATININE: 1.68 mg/dL — AB (ref 0.70–1.18)
Chloride: 103 mmol/L (ref 98–110)
GFR, EST AFRICAN AMERICAN: 47 mL/min — AB (ref >=60)
GFR, Est Non African American: 41 mL/min — ABNORMAL LOW (ref >=60)
Glucose, Bld: 90 mg/dL (ref 65–99)
Potassium: 4 mmol/L (ref 3.5–5.3)
SODIUM: 140 mmol/L (ref 135–146)

## 2017-03-13 LAB — CBC WITH DIFFERENTIAL/PLATELET
BASOS ABS: 0 {cells}/uL (ref 0–200)
Basophils Relative: 0 %
EOS ABS: 79 {cells}/uL (ref 15–500)
EOS PCT: 1 %
HCT: 43.1 % (ref 38.5–50.0)
Hemoglobin: 14.2 g/dL (ref 13.2–17.1)
LYMPHS ABS: 2133 {cells}/uL (ref 850–3900)
Lymphocytes Relative: 27 %
MCH: 29.5 pg (ref 27.0–33.0)
MCHC: 32.9 g/dL (ref 32.0–36.0)
MCV: 89.6 fL (ref 80.0–100.0)
MONO ABS: 869 {cells}/uL (ref 200–950)
MONOS PCT: 11 %
MPV: 9.9 fL (ref 7.5–12.5)
NEUTROS PCT: 61 %
Neutro Abs: 4819 cells/uL (ref 1500–7800)
PLATELETS: 261 10*3/uL (ref 140–400)
RBC: 4.81 MIL/uL (ref 4.20–5.80)
RDW: 13.6 % (ref 11.0–15.0)
WBC: 7.9 10*3/uL (ref 3.8–10.8)

## 2017-03-13 LAB — LIPID PANEL
CHOL/HDL RATIO: 4.3 ratio (ref <5.0)
CHOLESTEROL: 167 mg/dL (ref <200)
HDL: 39 mg/dL — AB (ref >40)
LDL Cholesterol: 94 mg/dL (ref <100)
Triglycerides: 172 mg/dL — ABNORMAL HIGH (ref <150)
VLDL: 34 mg/dL — ABNORMAL HIGH (ref <30)

## 2017-03-13 LAB — HEPATIC FUNCTION PANEL
ALT: 18 U/L (ref 9–46)
AST: 25 U/L (ref 10–35)
Albumin: 4.4 g/dL (ref 3.6–5.1)
Alkaline Phosphatase: 44 U/L (ref 40–115)
BILIRUBIN DIRECT: 0.1 mg/dL (ref <=0.2)
BILIRUBIN TOTAL: 0.4 mg/dL (ref 0.2–1.2)
Indirect Bilirubin: 0.3 mg/dL (ref 0.2–1.2)
Total Protein: 7.1 g/dL (ref 6.1–8.1)

## 2017-03-13 LAB — TSH: TSH: 2.97 mIU/L (ref 0.40–4.50)

## 2017-03-14 LAB — HEMOGLOBIN A1C
HEMOGLOBIN A1C: 5.5 % (ref <5.7)
Mean Plasma Glucose: 111 mg/dL

## 2017-03-14 LAB — MAGNESIUM: MAGNESIUM: 2.2 mg/dL (ref 1.5–2.5)

## 2017-03-14 LAB — VITAMIN D 25 HYDROXY (VIT D DEFICIENCY, FRACTURES): VIT D 25 HYDROXY: 63 ng/mL (ref 30–100)

## 2017-03-14 LAB — INSULIN, RANDOM: INSULIN: 13.4 u[IU]/mL (ref 2.0–19.6)

## 2017-05-13 ENCOUNTER — Other Ambulatory Visit: Payer: Self-pay | Admitting: Internal Medicine

## 2017-07-02 NOTE — Progress Notes (Signed)
Assessment and Plan:    Labile hypertension - continue medications, DASH diet, exercise and monitor at home. Call if greater than 130/80.  -     CBC with Differential/Platelet -     BASIC METABOLIC PANEL WITH GFR -     Hepatic function panel  Hypothyroidism, unspecified type Hypothyroidism-check TSH level, continue medications the same, reminded to take on an empty stomach 30-38mins before food.  -     TSH  Mixed hyperlipidemia -continue medications, check lipids, decrease fatty foods, increase activity.  -     Lipid panel  Medication management -     Magnesium  Morton neuroma, left Wear wider shoe, ice, will send to podiatry -     Ambulatory referral to [place] and meds as discussed. Further disposition pending results of labs. Future Appointments Date Time Provider [place]  10/31/2017 3:00 PM Unk Pinto, MD [facility] None    HPI 71 y.o. male  presents for 3 month follow up with hypertension, hyperlipidemia, prediabetes and vitamin D. Feels like a balled up sock under left foot x 5 years, getting worse and getting same on left foot.  His blood pressure has been controlled at home, today their BP is   He does workout. He denies chest pain, shortness of breath, dizziness.  He is on cholesterol medication and denies myalgias. His cholesterol is at goal. The cholesterol last visit was:   Lab Results  Component Value Date   CHOL 167 03/13/2017   HDL 39 (L) 03/13/2017   LDLCALC 94 03/13/2017   TRIG 172 (H) 03/13/2017   CHOLHDL 4.3 03/13/2017   He has been working on diet and exercise for prediabetes, he is down 9 lbs with diet, and denies paresthesia of the feet, polydipsia and polyuria. Last A1C in the office was:  Lab Results  Component Value Date   HGBA1C 5.5 03/13/2017   Patient is on Vitamin D supplement.   Lab Results  Component Value Date   VD25OH 76 03/13/2017     He is on thyroid medication. His medication was not changed last  visit. Patient denies heat / cold intolerance, nervousness and palpitations.  Lab Results  Component Value Date   TSH 2.97 03/13/2017  .   Current Medications:  Current Outpatient Prescriptions on File Prior to Visit  Medication Sig Dispense Refill  . aspirin 325 MG tablet Take 150 mg by mouth every other day. Taking 1/2    . Cholecalciferol (VITAMIN D PO) Take 6,000 Int'l Units by mouth daily.    . fenofibrate 160 MG tablet TAKE 1 TABLET BY MOUTH  DAILY 90 tablet 1  . levothyroxine (SYNTHROID, LEVOTHROID) 100 MCG tablet TAKE 1 TABLET BY MOUTH EVERY DAY ON AN EMPTY STOMACH FOR 30-60 MINUTES 90 tablet 1  . MAGNESIUM PO Take 400 mg by mouth daily.     . Multiple Vitamins-Minerals (ZINC PO) Take by mouth.    . Omega-3 Fatty Acids (FISH OIL PO) Take by mouth daily.    . rosuvastatin (CRESTOR) 40 MG tablet Take 1 tablet by mouth  every day for cholesterol 90 tablet 1  . tamsulosin (FLOMAX) 0.4 MG CAPS capsule      No current facility-administered medications on file prior to visit.    Medical History:  Past Medical History:  Diagnosis Date  . Hyperlipidemia   . Hypothyroid   . Labile hypertension   . Prediabetes   . Vitamin D deficiency    Allergies:  Allergies  Allergen Reactions  .  Prednisolone     High doses make her "nervous"    Review of Systems  Constitutional: Negative.   HENT: Negative.   Eyes: Negative.   Respiratory: Negative.   Cardiovascular: Negative.   Gastrointestinal: Negative for abdominal pain, blood in stool, constipation, diarrhea, heartburn, melena, nausea and vomiting.  Genitourinary: Negative.   Musculoskeletal: Negative.   Skin: Negative.   Neurological: Negative.   Endo/Heme/Allergies: Negative.   Psychiatric/Behavioral: Negative.     Family history- Review and unchanged Social history- Review and unchanged Physical Exam: There were no vitals taken for this visit. Wt Readings from Last 3 Encounters:  03/13/17 182 lb 6.4 oz (82.7 kg)   12/12/16 185 lb (83.9 kg)  09/08/16 182 lb 9.6 oz (82.8 kg)   General Appearance: Well nourished, in no apparent distress. Eyes: PERRLA, EOMs, conjunctiva no swelling or erythema Sinuses: No Frontal/maxillary tenderness ENT/Mouth: Ext aud canals clear, TMs without erythema, bulging. No erythema, swelling, or exudate on post pharynx.  Tonsils not swollen or erythematous. Hearing normal.  Neck: Supple, thyroid normal.  Respiratory: Respiratory effort normal, BS equal bilaterally without rales, rhonchi, wheezing or stridor.  Cardio: RRR with no MRGs. Brisk peripheral pulses without edema.  Abdomen: Soft, + BS.  Non tender, no guarding, rebound, hernias, masses. Lymphatics: Non tender without lymphadenopathy.  Musculoskeletal: Full ROM, 5/5 strength, normal gait. , full ROM, good cap refill.  Skin: Warm, dry without rashes, lesions, ecchymosis.  Neuro: Cranial nerves intact. Normal muscle tone, no cerebellar symptoms. Sensation intact.  Psych: Awake and oriented X 3, normal affect, Insight and Judgment appropriate.    Vicie Mutters, PA-C 7:54 PM [facility]

## 2017-07-03 ENCOUNTER — Ambulatory Visit (INDEPENDENT_AMBULATORY_CARE_PROVIDER_SITE_OTHER): Payer: 59 | Admitting: Physician Assistant

## 2017-07-03 ENCOUNTER — Encounter: Payer: Self-pay | Admitting: Physician Assistant

## 2017-07-03 VITALS — BP 118/76 | HR 61 | Temp 97.5°F | Ht 71.5 in | Wt 182.0 lb

## 2017-07-03 DIAGNOSIS — R0989 Other specified symptoms and signs involving the circulatory and respiratory systems: Secondary | ICD-10-CM | POA: Diagnosis not present

## 2017-07-03 DIAGNOSIS — Z79899 Other long term (current) drug therapy: Secondary | ICD-10-CM | POA: Diagnosis not present

## 2017-07-03 DIAGNOSIS — G5762 Lesion of plantar nerve, left lower limb: Secondary | ICD-10-CM | POA: Diagnosis not present

## 2017-07-03 DIAGNOSIS — E782 Mixed hyperlipidemia: Secondary | ICD-10-CM

## 2017-07-03 DIAGNOSIS — E039 Hypothyroidism, unspecified: Secondary | ICD-10-CM | POA: Diagnosis not present

## 2017-07-03 LAB — CBC WITH DIFFERENTIAL/PLATELET
BASOS PCT: 1 %
Basophils Absolute: 72 cells/uL (ref 0–200)
EOS PCT: 2 %
Eosinophils Absolute: 144 cells/uL (ref 15–500)
HCT: 43.8 % (ref 38.5–50.0)
Hemoglobin: 14.6 g/dL (ref 13.2–17.1)
Lymphocytes Relative: 32 %
Lymphs Abs: 2304 cells/uL (ref 850–3900)
MCH: 30.2 pg (ref 27.0–33.0)
MCHC: 33.3 g/dL (ref 32.0–36.0)
MCV: 90.5 fL (ref 80.0–100.0)
MONOS PCT: 8 %
MPV: 10 fL (ref 7.5–12.5)
Monocytes Absolute: 576 cells/uL (ref 200–950)
NEUTROS ABS: 4104 {cells}/uL (ref 1500–7800)
Neutrophils Relative %: 57 %
PLATELETS: 257 10*3/uL (ref 140–400)
RBC: 4.84 MIL/uL (ref 4.20–5.80)
RDW: 13.3 % (ref 11.0–15.0)
WBC: 7.2 10*3/uL (ref 3.8–10.8)

## 2017-07-03 LAB — BASIC METABOLIC PANEL WITH GFR
BUN: 23 mg/dL (ref 7–25)
CHLORIDE: 104 mmol/L (ref 98–110)
CO2: 26 mmol/L (ref 20–32)
CREATININE: 1.65 mg/dL — AB (ref 0.70–1.18)
Calcium: 9.7 mg/dL (ref 8.6–10.3)
GFR, EST AFRICAN AMERICAN: 48 mL/min — AB (ref >=60)
GFR, Est Non African American: 41 mL/min — ABNORMAL LOW (ref >=60)
Glucose, Bld: 81 mg/dL (ref 65–99)
POTASSIUM: 4.2 mmol/L (ref 3.5–5.3)
SODIUM: 141 mmol/L (ref 135–146)

## 2017-07-03 LAB — HEPATIC FUNCTION PANEL
ALBUMIN: 4.5 g/dL (ref 3.6–5.1)
ALT: 22 U/L (ref 9–46)
AST: 27 U/L (ref 10–35)
Alkaline Phosphatase: 49 U/L (ref 40–115)
BILIRUBIN INDIRECT: 0.4 mg/dL (ref 0.2–1.2)
Bilirubin, Direct: 0.1 mg/dL (ref <=0.2)
TOTAL PROTEIN: 7.2 g/dL (ref 6.1–8.1)
Total Bilirubin: 0.5 mg/dL (ref 0.2–1.2)

## 2017-07-03 LAB — LIPID PANEL
CHOLESTEROL: 156 mg/dL (ref <200)
HDL: 42 mg/dL (ref >40)
LDL Cholesterol: 88 mg/dL (ref <100)
TRIGLYCERIDES: 130 mg/dL (ref <150)
Total CHOL/HDL Ratio: 3.7 Ratio (ref <5.0)
VLDL: 26 mg/dL (ref <30)

## 2017-07-03 NOTE — Patient Instructions (Signed)
Morton Neuralgia Morton neuralgia is a type of foot pain in the area closest to your toes. This area is sometimes called the ball of your foot. Morton neuralgia occurs when a branch of a nerve in your foot (digital nerve) becomes compressed. When this happens over a long period of time, the nerve can thicken (neuroma) and cause pain. This usually occurs between the third and fourth toe. Morton neuralgia can come and go but may get worse over time. What are the causes? Your digital nerve can become compressed and stretched at a point where it passes under a thick band of tissue that connects your toes (intermetatarsal ligament). Morton neuralgia can be caused by mild repetitive damage in this area. This type of damage can result from:  Activities such as running or jumping.  Wearing shoes that are too tight.  What increases the risk? You may be at risk for Morton neuralgia if you:  Wear shoes that are narrow or tight.  Participate in activities that stretch your toes. These include: ? Running. ? [place]. ? Long-distance walking.  What are the signs or symptoms? The first symptom of Morton neuralgia is pain that spreads from the ball of your foot to your toes. It may feel like you are walking on a marble. Pain usually gets worse with walking and goes away at night. Other symptoms may include numbness and cramping of your toes. How is this diagnosed? Your health care provider will do a physical exam. When doing the exam, your health care provider may:  Squeeze your foot just behind your toe.  Ask you to move your toes to check for pain.  You may also have tests on your foot to confirm the diagnosis. These may include:  An X-ray.  An MRI.  How is this treated? Treatment for Morton neuralgia may be as simple as changing the kind of shoes you wear. Other treatments may include:  Wearing a supportive pad (orthosis) under the front of your foot. This lifts your toe bones and takes  pressure off the nerve.  Getting injections of numbing medicine and anti-inflammatory medicine (steroid) in the nerve.  Having surgery to remove part of the thickened nerve.  Follow these instructions at home:  Take medicine only as directed by your health care provider.  Wear soft-soled shoes with a wide toe area.  Stop activities that may be causing pain.  Elevate your foot when resting.  Massage your foot.  Apply ice to the injured area: ? Put ice in a plastic bag. ? Place a towel between your skin and the bag. ? Leave the ice on for 20 minutes, 2-3 times a day.  Keep all follow-up visits as directed by your health care provider. This is important. Contact a health care provider if:  Home care instructions are not helping you get better.  Your symptoms change or get worse. This information is not intended to replace advice given to you by your health care provider. Make sure you discuss any questions you have with your health care provider. Document Released: 02/13/2001 Document Revised: 04/14/2016 Document Reviewed: 01/08/2014 Elsevier Interactive Patient Education  [employer].

## 2017-07-04 LAB — MAGNESIUM: Magnesium: 2.2 mg/dL (ref 1.5–2.5)

## 2017-07-04 LAB — TSH: TSH: 3.3 m[IU]/L (ref 0.40–4.50)

## 2017-07-04 NOTE — Progress Notes (Signed)
Pt aware of lab results & voiced understanding of those results.

## 2017-07-04 NOTE — Progress Notes (Signed)
LVM for pt to return office call for LAB results.

## 2017-07-31 ENCOUNTER — Ambulatory Visit (INDEPENDENT_AMBULATORY_CARE_PROVIDER_SITE_OTHER): Payer: 59 | Admitting: Podiatry

## 2017-07-31 ENCOUNTER — Ambulatory Visit (INDEPENDENT_AMBULATORY_CARE_PROVIDER_SITE_OTHER): Payer: 59

## 2017-07-31 DIAGNOSIS — M79671 Pain in right foot: Secondary | ICD-10-CM | POA: Diagnosis not present

## 2017-07-31 DIAGNOSIS — G5760 Lesion of plantar nerve, unspecified lower limb: Secondary | ICD-10-CM | POA: Diagnosis not present

## 2017-07-31 DIAGNOSIS — M79672 Pain in left foot: Secondary | ICD-10-CM | POA: Diagnosis not present

## 2017-07-31 NOTE — Progress Notes (Signed)
   Subjective:    Patient ID: Tyler Gilbert, male    DOB: October 01, 1946, 71 y.o.   MRN: [id number]  HPI    Review of Systems  All other systems reviewed and are negative.      Objective:   Physical Exam        Assessment & Plan:

## 2017-08-04 NOTE — Progress Notes (Signed)
Patient ID: SANUEL LADNIER, male   DOB: 1946/10/09, 71 y.o.   MRN: [id number]   HPI: 71 year old male presents the office today for evaluation of bilateral foot pain. Patient states that his bilateral forefoot hurts and it feels like socks are bunched up in his shoes. He feels like there is a lump in his foot. He currently denies any significant pain. This SENSATION is been going on approximately 2-3 years now. No alleviating or aggravating factors  Past Medical History:  Diagnosis Date  . Hyperlipidemia   . Hypothyroid   . Labile hypertension   . Prediabetes   . Vitamin D deficiency      Physical Exam: General: The patient is alert and oriented x3 in no acute distress.  Dermatology: Skin is warm, dry and supple bilateral lower extremities. Negative for open lesions or macerations.  Vascular: Palpable pedal pulses bilaterally. No edema or erythema noted. Capillary refill within normal limits.  Neurological: Epicritic and protective threshold grossly intact bilaterally.   Musculoskeletal Exam: Range of motion within normal limits to all pedal and ankle joints bilateral. Muscle strength 5/5 in all groups bilateral.   Radiographic Exam:  Normal osseous mineralization. Joint spaces preserved. No fracture/dislocation/boney destruction.    Assessment: -  Possible Morton's neuroma bilateral forefoot   Plan of Care:  - Patient was evaluated today. X-rays reviewed today. - Today I discussed the etiology of the Morton's neuroma and that there is unlikely aggravating factor irritating the nerve. - Recommend the importance of wearing good supportive shoe gear -  Recommended over-the-counter insoles with good arch supports Return to clinic when necessary-    Edrick Kins, DPM [facility]  Dr. Edrick Kins, DPM    [address], [place] [address]                Office [phone]  Fax [phone]

## 2017-10-31 ENCOUNTER — Encounter: Payer: Self-pay | Admitting: Internal Medicine

## 2017-11-30 ENCOUNTER — Ambulatory Visit: Payer: 59 | Admitting: Internal Medicine

## 2017-11-30 VITALS — BP 124/68 | HR 68 | Temp 97.3°F | Resp 16 | Ht 71.0 in | Wt 181.0 lb

## 2017-11-30 DIAGNOSIS — E782 Mixed hyperlipidemia: Secondary | ICD-10-CM

## 2017-11-30 DIAGNOSIS — R0989 Other specified symptoms and signs involving the circulatory and respiratory systems: Secondary | ICD-10-CM

## 2017-11-30 DIAGNOSIS — Z0000 Encounter for general adult medical examination without abnormal findings: Secondary | ICD-10-CM

## 2017-11-30 DIAGNOSIS — I10 Essential (primary) hypertension: Secondary | ICD-10-CM

## 2017-11-30 DIAGNOSIS — Z0001 Encounter for general adult medical examination with abnormal findings: Secondary | ICD-10-CM

## 2017-11-30 DIAGNOSIS — Z23 Encounter for immunization: Secondary | ICD-10-CM

## 2017-11-30 DIAGNOSIS — E039 Hypothyroidism, unspecified: Secondary | ICD-10-CM

## 2017-11-30 DIAGNOSIS — R7309 Other abnormal glucose: Secondary | ICD-10-CM

## 2017-11-30 DIAGNOSIS — E559 Vitamin D deficiency, unspecified: Secondary | ICD-10-CM

## 2017-11-30 DIAGNOSIS — Z1212 Encounter for screening for malignant neoplasm of rectum: Secondary | ICD-10-CM

## 2017-11-30 DIAGNOSIS — Z136 Encounter for screening for cardiovascular disorders: Secondary | ICD-10-CM | POA: Diagnosis not present

## 2017-11-30 DIAGNOSIS — R972 Elevated prostate specific antigen [PSA]: Secondary | ICD-10-CM

## 2017-11-30 DIAGNOSIS — N138 Other obstructive and reflux uropathy: Secondary | ICD-10-CM

## 2017-11-30 DIAGNOSIS — R7303 Prediabetes: Secondary | ICD-10-CM

## 2017-11-30 DIAGNOSIS — N401 Enlarged prostate with lower urinary tract symptoms: Secondary | ICD-10-CM

## 2017-11-30 DIAGNOSIS — Z79899 Other long term (current) drug therapy: Secondary | ICD-10-CM

## 2017-11-30 DIAGNOSIS — Z125 Encounter for screening for malignant neoplasm of prostate: Secondary | ICD-10-CM

## 2017-11-30 DIAGNOSIS — Z1211 Encounter for screening for malignant neoplasm of colon: Secondary | ICD-10-CM

## 2017-11-30 MED ORDER — DIAZEPAM 5 MG PO TABS: ORAL_TABLET | ORAL | 0 refills | Status: DC

## 2017-11-30 MED ORDER — DEXAMETHASONE 0.5 MG PO TABS: ORAL_TABLET | ORAL | 0 refills | Status: DC

## 2017-11-30 NOTE — Patient Instructions (Signed)

## 2017-11-30 NOTE — Progress Notes (Signed)
[facility]   Unk Pinto, M.D.     Uvaldo Bristle. Silverio Lay, P.A.-C Liane Comber, [place]                [address] [place] [address] Telephone [phone] Telefax [phone] Annual  Screening/Preventative Visit  & Comprehensive Evaluation & Examination     This very nice 72 y.o. MWM presents for a Screening/Preventative Visit & comprehensive evaluation and management of multiple medical co-morbidities.  Patient has been followed expectantly for labile HTN, Prediabetes, Hyperlipidemia and Vitamin D Deficiency. 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     HTN predates since 2008. Patient's BP has been controlled at home.  Today's BP is at goal - 124/68. Patient denies any cardiac symptoms as chest pain, palpitations, shortness of breath, dizziness or ankle swelling.     Patient's hyperlipidemia is controlled with diet and medications. Patient denies myalgias or other medication SE's. Last lipids were at goal: Lab Results  Component Value Date   CHOL 156 07/03/2017   HDL 42 07/03/2017   LDLCALC 88 07/03/2017   TRIG 130 07/03/2017   CHOLHDL 3.7 07/03/2017      Patient has prediabetes (A1c 5.8%/2012 and 6.1%/2014)  and patient denies reactive hypoglycemic symptoms, visual blurring, diabetic polys or paresthesias. Last A1c was Normal & at goal: Lab Results  Component Value Date   HGBA1C 5.5 03/13/2017       Patient was found Hypothyroid in 2008 and has since been on Thyroid Replacement.  Finally, patient has history of Vitamin D Deficiency ("30"/2008) and last vitamin D was at goal: Lab Results  Component Value Date   VD25OH 63 03/13/2017   Current Outpatient Medications on File Prior to Visit  Medication Sig  . aspirin 325 MG tablet Take 150 mg by mouth every other day. Taking 1/2  . Cholecalciferol (VITAMIN D PO) Take 8,000 Int'l Units by mouth daily.   .  fenofibrate 160 MG tablet TAKE 1 TABLET BY MOUTH  DAILY  . levothyroxine (SYNTHROID, LEVOTHROID) 100 MCG tablet TAKE 1 TABLET BY MOUTH EVERY DAY ON AN EMPTY STOMACH FOR 30-60 MINUTES  . MAGNESIUM PO Take 400 mg by mouth daily.   . Multiple Vitamins-Minerals (ZINC PO) Take by mouth.  . Omega-3 Fatty Acids (FISH OIL PO) Take by mouth daily.  . rosuvastatin (CRESTOR) 40 MG tablet Take 1 tablet by mouth  every day for cholesterol  . tamsulosin (FLOMAX) 0.4 MG CAPS capsule    No current facility-administered medications on file prior to visit.    Allergies  Allergen Reactions  . Prednisolone     High doses make her "nervous"   Past Medical History:  Diagnosis Date  . Hyperlipidemia   . Hypothyroid   . Labile hypertension   . Prediabetes   . Vitamin D deficiency    Health Maintenance  Topic Date Due  . Hepatitis C Screening  01/05/1946  . PNA vac Low Risk Adult (1 of 2 - PCV13) 01/25/2013  . INFLUENZA VACCINE  06/21/2017  . TETANUS/TDAP  12/04/2018  . COLONOSCOPY  07/02/2020   Immunization History  Administered Date(s) Administered  . Influenza-Unspecified 08/31/2016  . PPD Test 02/06/2014, 05/04/2015  . Pneumococcal Conjugate-13 11/30/2017  . Pneumococcal Polysaccharide-23 01/26/2012  . Tdap 12/04/2008  . Zoster 11/07/2013   Last Colon - 01/2009 recc 10 yr f/u in 01/2019.  Past Surgical History:  Procedure Laterality Date  . COLONOSCOPY  2010  . [place] and 1995   S1 and L4  . ORTHOPEDIC SURGERY Left 1991   left disc in [place]  . OTHER SURGICAL HISTORY     Tonsillectomy bilateral at age 32  . POLYPECTOMY  2010   Family History  Problem Relation Age of Onset  . Diverticulosis Mother   . Cancer Mother   . Lymphoma Mother   . Heart disease Father   . Heart attack Father   . Colitis Brother   . Esophageal cancer Neg Hx   . Rectal cancer Neg Hx   . Stomach cancer Neg Hx   . Colon cancer Maternal Grandmother    Socioeconomic History  . Marital  status: Married  Occupational History  . Works [employer]  Tobacco Use  . Smoking status: Never Smoker  . Smokeless tobacco: Never Used  Substance and Sexual Activity  . Alcohol use: No    Alcohol/week: 0.0 oz  . Drug use: No  . Sexual activity: Active    ROS Constitutional: Denies fever, chills, weight loss/gain, headaches, insomnia,  night sweats or change in appetite. Does c/o fatigue. Eyes: Denies redness, blurred vision, diplopia, discharge, itchy or watery eyes.  ENT: Denies discharge, congestion, post nasal drip, epistaxis, sore throat, earache, hearing loss, dental pain, Tinnitus, Vertigo, Sinus pain or snoring.  Cardio: Denies chest pain, palpitations, irregular heartbeat, syncope, dyspnea, diaphoresis, orthopnea, PND, claudication or edema Respiratory: denies cough, dyspnea, DOE, pleurisy, hoarseness, laryngitis or wheezing.  Gastrointestinal: Denies dysphagia, heartburn, reflux, water brash, pain, cramps, nausea, vomiting, bloating, diarrhea, constipation, hematemesis, melena, hematochezia, jaundice or hemorrhoids Genitourinary: Denies dysuria, frequency, urgency,  discharge, hematuria or flank pain . Has nocturia 1-2 x and occas hesitancy. Musculoskeletal: Denies arthralgia, myalgia, stiffness, Jt. Swelling, pain, limp or strain/sprain. Denies Falls. Skin: Denies puritis, rash, hives, warts, acne, eczema or change in skin lesion Neuro: No weakness, tremor, incoordination, spasms, paresthesia or pain Psychiatric: Denies confusion, memory loss or sensory loss. Denies Depression. Endocrine: Denies change in weight, skin, hair change, nocturia, and paresthesia, diabetic polys, visual blurring or hyper / hypo glycemic episodes.  Heme/Lymph: No excessive bleeding, bruising or enlarged lymph nodes.  Physical Exam  BP 124/68   Pulse 68   Temp (!) 97.3 F (36.3 C)   Resp 16   Ht 5\' 11"  (1.803 m)   Wt 181 lb (82.1 kg)   BMI 25.24 kg/m   General  Appearance: Well nourished and well groomed and in no apparent distress.  Eyes: PERRLA, EOMs, conjunctiva no swelling or erythema, normal fundi and vessels. Sinuses: No frontal/maxillary tenderness ENT/Mouth: EACs patent / TMs  nl. Nares clear without erythema, swelling, mucoid exudates. Oral hygiene is good. No erythema, swelling, or exudate. Tongue normal, non-obstructing. Tonsils not swollen or erythematous. Hearing normal.  Neck: Supple, thyroid normal. No bruits, nodes or JVD. Respiratory: Respiratory effort normal.  BS equal and clear bilateral without rales, rhonci, wheezing or stridor. Cardio: Heart sounds are normal with regular rate and rhythm and no murmurs, rubs or gallops. Peripheral pulses are normal and equal bilaterally without edema. No aortic or femoral bruits. Chest: symmetric with normal excursions and percussion.  Abdomen: Soft, with Nl bowel sounds. Nontender, no guarding, rebound, hernias, masses, or organomegaly.  Lymphatics: Non tender without lymphadenopathy.  Genitourinary: No hernias.Testes nl. DRE - prostate nl for age - smooth & firm w/o nodules. Musculoskeletal: Full ROM all peripheral  extremities, joint stability, 5/5 strength, and normal gait. Skin: Warm and dry without rashes, lesions, cyanosis, clubbing or  ecchymosis.  Neuro: Cranial nerves intact, reflexes equal bilaterally. Normal muscle tone, no cerebellar symptoms. Sensation intact.  Pysch: Alert and oriented X 3 with normal affect, insight and judgment appropriate.   Assessment and Plan  1. Annual Preventative/Screening Exam   2. Labile hypertension  - EKG 12-Lead - [place], RETROPERITNL ABD,  LTD - Urinalysis, Routine w reflex microscopic - Microalbumin / creatinine urine ratio - CBC with Differential/Platelet - BASIC METABOLIC PANEL WITH GFR - Magnesium - TSH  3. Hyperlipidemia, mixed  - EKG 12-Lead - [place], RETROPERITNL ABD,  LTD - Hepatic function panel - Lipid panel - TSH  4.  Prediabetes  - EKG 12-Lead - [place], RETROPERITNL ABD,  LTD - Hemoglobin A1c - Insulin, random  5. Vitamin D deficiency  - VITAMIN D 25 Hydroxy  6. Abnormal glucose  - Hemoglobin A1c - Insulin, random  7. Hypothyroidism, unspecified type  - TSH  8. Screening for colorectal cancer  - POC Hemoccult Bld/Stl   9. Prostate cancer screening  - PSA  10. Elevated PSA  - PSA  11. BPH with obstruction/LUTS  - PSA  12. Screening for ischemic heart disease  - EKG 12-Lead  13. Screening for AAA (aortic abdominal aneurysm)  - [place], RETROPERITNL ABD,  LTD  14. Medication management  - Urinalysis, Routine w reflex microscopic - Microalbumin / creatinine urine ratio - CBC with Differential/Platelet - BASIC METABOLIC PANEL WITH GFR - Hepatic function panel - Magnesium - Lipid panel - TSH - Hemoglobin A1c - Insulin, random - VITAMIN D 25 Hydroxy  15. Need for prophylactic vaccination against Streptococcus pneumoniae (pneumococcus)  - Pneumococcal conjugate vaccine 13-valent       Patient was counseled in prudent diet, weight control to achieve/maintain BMI less than 25, BP monitoring, regular exercise and medications as discussed.  Discussed med effects and SE's. Routine screening labs and tests as requested with regular follow-up as recommended. Over 40 minutes of exam, counseling, chart review and high complex critical decision making was performed

## 2017-12-01 LAB — URINALYSIS, ROUTINE W REFLEX MICROSCOPIC
BACTERIA UA: NONE SEEN /HPF
Bilirubin Urine: NEGATIVE
Glucose, UA: NEGATIVE
HGB URINE DIPSTICK: NEGATIVE
Hyaline Cast: NONE SEEN /LPF
KETONES UR: NEGATIVE
LEUKOCYTES UA: NEGATIVE
NITRITE: NEGATIVE
PROTEIN: NEGATIVE
RBC / HPF: NONE SEEN /HPF (ref 0–2)
SQUAMOUS EPITHELIAL / LPF: NONE SEEN /HPF (ref <=5)
Specific Gravity, Urine: 1.021 (ref 1.001–1.03)
WBC, UA: NONE SEEN /HPF (ref 0–5)
pH: 7 (ref 5.0–8.0)

## 2017-12-01 LAB — CBC WITH DIFFERENTIAL/PLATELET
BASOS PCT: 0.7 %
Basophils Absolute: 58 cells/uL (ref 0–200)
EOS ABS: 291 {cells}/uL (ref 15–500)
Eosinophils Relative: 3.5 %
HCT: 42.9 % (ref 38.5–50.0)
Hemoglobin: 14.7 g/dL (ref 13.2–17.1)
Lymphs Abs: 1959 cells/uL (ref 850–3900)
MCH: 30 pg (ref 27.0–33.0)
MCHC: 34.3 g/dL (ref 32.0–36.0)
MCV: 87.6 fL (ref 80.0–100.0)
MONOS PCT: 9 %
MPV: 10.5 fL (ref 7.5–12.5)
NEUTROS PCT: 63.2 %
Neutro Abs: 5246 cells/uL (ref 1500–7800)
PLATELETS: 283 10*3/uL (ref 140–400)
RBC: 4.9 10*6/uL (ref 4.20–5.80)
RDW: 12.4 % (ref 11.0–15.0)
TOTAL LYMPHOCYTE: 23.6 %
WBC mixed population: 747 cells/uL (ref 200–950)
WBC: 8.3 10*3/uL (ref 3.8–10.8)

## 2017-12-01 LAB — HEMOGLOBIN A1C
HEMOGLOBIN A1C: 5.7 %{Hb} — AB (ref <5.7)
Mean Plasma Glucose: 117 (calc)
eAG (mmol/L): 6.5 (calc)

## 2017-12-01 LAB — MICROALBUMIN / CREATININE URINE RATIO
CREATININE, URINE: 150 mg/dL (ref 20–320)
Microalb Creat Ratio: 3 mcg/mg creat (ref <30)
Microalb, Ur: 0.4 mg/dL

## 2017-12-01 LAB — BASIC METABOLIC PANEL WITH GFR
BUN / CREAT RATIO: 14 (calc) (ref 6–22)
BUN: 23 mg/dL (ref 7–25)
CHLORIDE: 104 mmol/L (ref 98–110)
CO2: 30 mmol/L (ref 20–32)
Calcium: 10.1 mg/dL (ref 8.6–10.3)
Creat: 1.68 mg/dL — ABNORMAL HIGH (ref 0.70–1.18)
GFR, EST AFRICAN AMERICAN: 47 mL/min/{1.73_m2} — AB (ref >=60)
GFR, Est Non African American: 41 mL/min/{1.73_m2} — ABNORMAL LOW (ref >=60)
Glucose, Bld: 95 mg/dL (ref 65–99)
POTASSIUM: 4.5 mmol/L (ref 3.5–5.3)
SODIUM: 142 mmol/L (ref 135–146)

## 2017-12-01 LAB — HEPATIC FUNCTION PANEL
AG Ratio: 1.7 (calc) (ref 1.0–2.5)
ALKALINE PHOSPHATASE (APISO): 49 U/L (ref 40–115)
ALT: 20 U/L (ref 9–46)
AST: 26 U/L (ref 10–35)
Albumin: 4.7 g/dL (ref 3.6–5.1)
BILIRUBIN DIRECT: 0.1 mg/dL (ref 0.0–0.2)
BILIRUBIN INDIRECT: 0.4 mg/dL (ref 0.2–1.2)
Globulin: 2.7 g/dL (calc) (ref 1.9–3.7)
TOTAL PROTEIN: 7.4 g/dL (ref 6.1–8.1)
Total Bilirubin: 0.5 mg/dL (ref 0.2–1.2)

## 2017-12-01 LAB — PSA: PSA: 6.7 ng/mL — ABNORMAL HIGH (ref <=4.0)

## 2017-12-01 LAB — LIPID PANEL
CHOLESTEROL: 170 mg/dL (ref <200)
HDL: 39 mg/dL — ABNORMAL LOW (ref >40)
LDL CHOLESTEROL (CALC): 99 mg/dL
Non-HDL Cholesterol (Calc): 131 mg/dL (calc) — ABNORMAL HIGH (ref <130)
Total CHOL/HDL Ratio: 4.4 (calc) (ref <5.0)
Triglycerides: 208 mg/dL — ABNORMAL HIGH (ref <150)

## 2017-12-01 LAB — INSULIN, RANDOM: INSULIN: 14.5 u[IU]/mL (ref 2.0–19.6)

## 2017-12-01 LAB — MAGNESIUM: MAGNESIUM: 2 mg/dL (ref 1.5–2.5)

## 2017-12-01 LAB — VITAMIN D 25 HYDROXY (VIT D DEFICIENCY, FRACTURES): VIT D 25 HYDROXY: 61 ng/mL (ref 30–100)

## 2017-12-01 LAB — TSH: TSH: 4.25 m[IU]/L (ref 0.40–4.50)

## 2017-12-02 ENCOUNTER — Other Ambulatory Visit: Payer: Self-pay | Admitting: Internal Medicine

## 2017-12-03 ENCOUNTER — Encounter: Payer: Self-pay | Admitting: Internal Medicine

## 2017-12-13 ENCOUNTER — Other Ambulatory Visit: Payer: Self-pay

## 2017-12-13 DIAGNOSIS — Z1212 Encounter for screening for malignant neoplasm of rectum: Principal | ICD-10-CM

## 2017-12-13 DIAGNOSIS — Z1211 Encounter for screening for malignant neoplasm of colon: Secondary | ICD-10-CM

## 2017-12-13 LAB — POC HEMOCCULT BLD/STL (HOME/3-CARD/SCREEN)
FECAL OCCULT BLD: NEGATIVE
FECAL OCCULT BLD: NEGATIVE
Fecal Occult Blood, POC: NEGATIVE

## 2018-01-16 ENCOUNTER — Encounter: Payer: Self-pay | Admitting: Physician Assistant

## 2018-01-16 ENCOUNTER — Ambulatory Visit: Payer: 59 | Admitting: Physician Assistant

## 2018-01-16 VITALS — BP 130/74 | HR 73 | Temp 98.1°F | Ht 71.0 in | Wt 183.0 lb

## 2018-01-16 DIAGNOSIS — M791 Myalgia, unspecified site: Secondary | ICD-10-CM | POA: Diagnosis not present

## 2018-01-16 DIAGNOSIS — R531 Weakness: Secondary | ICD-10-CM | POA: Diagnosis not present

## 2018-01-16 DIAGNOSIS — H9202 Otalgia, left ear: Secondary | ICD-10-CM

## 2018-01-16 MED ORDER — PREDNISONE 10 MG PO TABS: ORAL_TABLET | ORAL | 0 refills | Status: DC

## 2018-01-16 NOTE — Progress Notes (Signed)
Subjective:    Patient ID: [facility], male    DOB: Mar 07, 1946, 72 y.o.   MRN: [id number]  HPI 72 y.o. WM presents with body aches/sensitivity, headache x Thursday night. Before this he has had right ear pain x 1-2 months, sneezing, rhinorrhea and now with weakness, and head pounding. He has had some joint pain, wrist and ankles. He has a rash on his chest x 3 - 4 days, itching, no tick exposure. No dizziness, no GERD, nausea, no appetite change, no diarrhea, no constipation, no urinary issues. No fever, chills. He is on benadryl every 6 hours. Bayer back pain reliever helping, increasing water and has taken time off to rest.   Blood pressure 130/74, pulse 73, temperature 98.1 F (36.7 C), height 5\' 11"  (1.803 m), weight 183 lb (83 kg), SpO2 96 %.  Medications Current Outpatient Medications on File Prior to Visit  Medication Sig  . aspirin 325 MG tablet Take 150 mg by mouth every other day. Taking 1/2  . Cholecalciferol (VITAMIN D PO) Take 8,000 Int'l Units by mouth daily.   Marland Kitchen dexamethasone (DECADRON) 0.5 MG tablet Take 1 tab 3 x day - 3 days, then 2 x day - 3 days, then 1 tab daily  . diazepam (VALIUM) 5 MG tablet Take 1/2 to 1 tablet 3 to 4 x / day for Muscle spasms.  . fenofibrate 160 MG tablet TAKE 1 TABLET BY MOUTH  DAILY  . levothyroxine (SYNTHROID, LEVOTHROID) 100 MCG tablet TAKE 1 TABLET BY MOUTH EVERY DAY ON AN EMPTY STOMACH FOR 30-60 MINUTES  . MAGNESIUM PO Take 400 mg by mouth daily.   . Multiple Vitamins-Minerals (ZINC PO) Take by mouth.  . Omega-3 Fatty Acids (FISH OIL PO) Take by mouth daily.  . rosuvastatin (CRESTOR) 40 MG tablet TAKE 1 TABLET BY MOUTH  EVERY DAY FOR CHOLESTEROL  . tamsulosin (FLOMAX) 0.4 MG CAPS capsule    No current facility-administered medications on file prior to visit.     Problem list He has COLONIC POLYPS; Labile hypertension; Hyperlipidemia; Prediabetes; Hypothyroid; Vitamin D deficiency; Medication management; Elevated PSA; and BPH with  obstruction/LUTS on their problem list.   Review of Systems See HPI    Objective:   Physical Exam  Constitutional: He is oriented to person, place, and time. He appears well-developed and well-nourished. No distress.  HENT:  Right Ear: Hearing, external ear and ear canal normal. No mastoid tenderness. Tympanic membrane is retracted. Tympanic membrane is not perforated, not erythematous and not bulging. A middle ear effusion is present.  Left Ear: Hearing, tympanic membrane, external ear and ear canal normal.  Eyes: Conjunctivae are normal. Pupils are equal, round, and reactive to light.  Neck: Normal range of motion. Neck supple.  Cardiovascular: Normal rate and regular rhythm.  No murmur heard. Pulmonary/Chest: Effort normal and breath sounds normal. He has no wheezes.  Abdominal: Soft. Bowel sounds are normal. There is no tenderness.  Musculoskeletal: Normal range of motion. He exhibits tenderness (diffuse).  Lymphadenopathy:    He has no cervical adenopathy.  Neurological: He is alert and oriented to person, place, and time. He displays normal reflexes. No cranial nerve deficit. Coordination normal.  Skin: Skin is warm and dry. Rash (erythematous papules with hair follicles along his chest) noted. He is not diaphoretic.       Assessment & Plan:    Left ear pain Stop benadryl, get on claritin, flonase, prednisone  Myalgia/weakness Stop benadryl, increase fluid ? From med, from virus, check  labs. No tick exposure -     BASIC METABOLIC PANEL WITH GFR -     CBC with Differential/Platelet -     Hepatic function panel -     Sedimentation rate -     CK -     predniSONE (DELTASONE) 10 MG tablet; 2 tablets daily for 3 days, 1 tablet daily for 4 days.     Future Appointments  Date Time Provider [place]  03/08/2018  4:30 PM Liane Comber, NP [facility] None  06/14/2018  9:30 AM Unk Pinto, MD [facility] None  12/31/2018  3:00 PM Unk Pinto, MD [facility]  None

## 2018-01-16 NOTE — Patient Instructions (Signed)
Your ears and sinuses are connected by the eustachian tube. When your sinuses are inflamed, this can close off the tube and cause fluid to collect in your middle ear. This can then cause dizziness, popping, clicking, ringing, and echoing in your ears. This is often NOT an infection and does NOT require antibiotics, it is caused by inflammation so the treatments help the inflammation. This can take a long time to get better so please be patient.  Here are things you can do to help with this: - Try the Flonase or Nasonex. Remember to spray each nostril twice towards the outer part of your eye.  Do not sniff but instead pinch your nose and tilt your head back to help the medicine get into your sinuses.  The best time to do this is at bedtime.Stop if you get blurred vision or nose bleeds.  -While drinking fluids, pinch and hold nose close and swallow, to help open eustachian tubes to drain fluid behind ear drums. -Please pick one of the over the counter allergy medications below and take it once daily for allergies.  It will also help with fluid behind ear drums. Claritin or loratadine cheapest but likely the weakest  Zyrtec or certizine at night because it can make you sleepy The strongest is allegra or fexafinadine  Cheapest at [employer], [employer], [employer] -can use decongestant over the counter, please do not use if you have high blood pressure or certain heart conditions.   if worsening HA, changes vision/speech, imbalance, weakness go to the ER    Make sure you are on an allergy pill, see below for more details. Please take the prednisone as directed below, this is NOT an antibiotic so you do NOT have to finish it. You can take it for a few days and stop it if you are doing better.   Please take the prednisone to help decrease inflammation and therefore decrease symptoms. Take it it with food to avoid GI upset. It can cause increased energy but on the other hand it can make it hard to sleep at night so  please take it AT [place], it takes 8-12 hours to start working so it will NOT affect your sleeping if you take it at night with your food!!  If you are diabetic it will increase your sugars so decrease carbs and monitor your sugars closely.      Folliculitis Folliculitis is inflammation of the hair follicles. Folliculitis most commonly occurs on the scalp, thighs, legs, back, and buttocks. However, it can occur anywhere on the body. What are the causes? This condition may be caused by:  A bacterial infection (common).  A fungal infection.  A viral infection.  Coming into contact with certain chemicals, especially oils and tars.  Shaving or waxing.  Applying greasy ointments or creams to your skin often.  Long-lasting folliculitis and folliculitis that keeps coming back can be caused by bacteria that live in the nostrils. What increases the risk? This condition is more likely to develop in people with:  A weakened immune system.  Diabetes.  Obesity.  What are the signs or symptoms? Symptoms of this condition include:  Redness.  Soreness.  Swelling.  Itching.  Small white or yellow, pus-filled, itchy spots (pustules) that appear over a reddened area. If there is an infection that goes deep into the follicle, these may develop into a boil (furuncle).  A group of closely packed boils (carbuncle). These tend to form in hairy, sweaty areas of  the body.  How is this diagnosed? This condition is diagnosed with a skin exam. To find what is causing the condition, your health care provider may take a sample of one of the pustules or boils for testing. How is this treated? This condition may be treated by:  Applying warm compresses to the affected areas.  Taking an antibiotic medicine or applying an antibiotic medicine to the skin.  Applying or bathing with an antiseptic solution.  Taking an over-the-counter medicine to help with itching.  Having a procedure  to drain any pustules or boils. This may be done if a pustule or boil contains a lot of pus or fluid.  Laser hair removal. This may be done to treat long-lasting folliculitis.  Follow these instructions at home:  If directed, apply heat to the affected area as often as told by your health care provider. Use the heat source that your health care provider recommends, such as a moist heat pack or a heating pad. ? Place a towel between your skin and the heat source. ? Leave the heat on for 20-30 minutes. ? Remove the heat if your skin turns bright red. This is especially important if you are unable to feel pain, heat, or cold. You may have a greater risk of getting burned.  If you were prescribed an antibiotic medicine, use it as told by your health care provider. Do not stop using the antibiotic even if you start to feel better.  Take over-the-counter and prescription medicines only as told by your health care provider.  Do not shave irritated skin.  Keep all follow-up visits as told by your health care provider. This is important. Get help right away if:  You have more redness, swelling, or pain in the affected area.  Red streaks are spreading from the affected area.  You have a fever. This information is not intended to replace advice given to you by your health care provider. Make sure you discuss any questions you have with your health care provider. Document Released: 01/16/2002 Document Revised: 05/27/2016 Document Reviewed: 08/28/2015 Elsevier Interactive Patient Education  2018 [employer].

## 2018-01-17 LAB — BASIC METABOLIC PANEL WITH GFR
BUN / CREAT RATIO: 15 (calc) (ref 6–22)
BUN: 27 mg/dL — ABNORMAL HIGH (ref 7–25)
CO2: 27 mmol/L (ref 20–32)
CREATININE: 1.78 mg/dL — AB (ref 0.70–1.18)
Calcium: 9.6 mg/dL (ref 8.6–10.3)
Chloride: 103 mmol/L (ref 98–110)
GFR, EST AFRICAN AMERICAN: 44 mL/min/{1.73_m2} — AB (ref >=60)
GFR, EST NON AFRICAN AMERICAN: 38 mL/min/{1.73_m2} — AB (ref >=60)
Glucose, Bld: 105 mg/dL — ABNORMAL HIGH (ref 65–99)
POTASSIUM: 4 mmol/L (ref 3.5–5.3)
SODIUM: 140 mmol/L (ref 135–146)

## 2018-01-17 LAB — CBC WITH DIFFERENTIAL/PLATELET
Basophils Absolute: 31 cells/uL (ref 0–200)
Basophils Relative: 0.5 %
EOS ABS: 159 {cells}/uL (ref 15–500)
Eosinophils Relative: 2.6 %
HEMATOCRIT: 43 % (ref 38.5–50.0)
HEMOGLOBIN: 14.7 g/dL (ref 13.2–17.1)
LYMPHS ABS: 1446 {cells}/uL (ref 850–3900)
MCH: 29.4 pg (ref 27.0–33.0)
MCHC: 34.2 g/dL (ref 32.0–36.0)
MCV: 86 fL (ref 80.0–100.0)
MPV: 10.2 fL (ref 7.5–12.5)
Monocytes Relative: 15.2 %
NEUTROS ABS: 3538 {cells}/uL (ref 1500–7800)
Neutrophils Relative %: 58 %
Platelets: 261 10*3/uL (ref 140–400)
RBC: 5 10*6/uL (ref 4.20–5.80)
RDW: 12.2 % (ref 11.0–15.0)
Total Lymphocyte: 23.7 %
WBC mixed population: 927 cells/uL (ref 200–950)
WBC: 6.1 10*3/uL (ref 3.8–10.8)

## 2018-01-17 LAB — HEPATIC FUNCTION PANEL
AG RATIO: 1.4 (calc) (ref 1.0–2.5)
ALKALINE PHOSPHATASE (APISO): 45 U/L (ref 40–115)
ALT: 21 U/L (ref 9–46)
AST: 28 U/L (ref 10–35)
Albumin: 4.2 g/dL (ref 3.6–5.1)
BILIRUBIN DIRECT: 0.1 mg/dL (ref 0.0–0.2)
BILIRUBIN TOTAL: 0.4 mg/dL (ref 0.2–1.2)
Globulin: 3 g/dL (calc) (ref 1.9–3.7)
Indirect Bilirubin: 0.3 mg/dL (calc) (ref 0.2–1.2)
Total Protein: 7.2 g/dL (ref 6.1–8.1)

## 2018-01-17 LAB — CK: CK TOTAL: 287 U/L — AB (ref 44–196)

## 2018-01-17 LAB — SEDIMENTATION RATE: Sed Rate: 22 mm/h — ABNORMAL HIGH (ref 0–20)

## 2018-01-23 NOTE — Progress Notes (Addendum)
Assessment and Plan:  Tyler Gilbert was seen today for follow-up.  Diagnoses and all orders for this visit:  Gastroesophageal reflux disease, esophagitis presence not specified Initiated medication for new reflux related symptoms:        -     ranitidine (ZANTAC) 150 MG tablet; Take 1 tablet (150 mg total) by mouth 2 (two) times daily as needed for reflux Discussed diet, avoiding triggers and other lifestyle changes  Medication management -     BASIC METABOLIC PANEL WITH GFR  Myalgia Resolved; will recheck abnormal labs and if resolved will attempt to restart statin - if any symptoms reoccur, have instructed to stop immediately and notify office. We may try pravastatin to see if this is better tolerated.  -     CK -     Sedimentation rate  Further disposition pending results of labs. Discussed med's effects and SE's.   Over 15 minutes of exam, counseling, chart review, and critical decision making was performed.   Future Appointments  Date Time Provider [place]  03/08/2018  4:30 PM Liane Comber, NP [facility] None  06/14/2018  9:30 AM Unk Pinto, MD [facility] None  12/31/2018  3:00 PM Unk Pinto, MD [facility] None    ------------------------------------------------------------------------------------------------------------------   HPI BP 124/80   Pulse 76   Temp 97.7 F (36.5 C)   Ht 5\' 11"  (1.803 m)   Wt 185 lb (83.9 kg)   SpO2 96%   BMI 25.80 kg/m   72 y.o.male presents for 1 week follow up after presenting with a constellation of bizarre complaints; body aches/sensitivity, headache for 4 days. Before this he had right ear pain x 1-2 months, sneezing, rhinorrhea and now with weakness, and head pounding. He had some joint pain, wrist and ankles. He had a rash on his chest x 3 - 4 days, itching. Basic labs and Sed rate and CK were checked and as below:   Component     Latest Ref Rng & Units 01/16/2018  Sed Rate     0 - 20 mm/h 22 (H)  CK Total  44 - 196 U/L 287 (H)   Previously on crestor 20 mg daily; was advised to stop this, and prescribed a steroid taper which he completed. Presents today for follow up reporting all symptoms have resolved and he is feeling much better.   He does discussed that previously intermittent/rare acid reflux (burning after meals, belching with "flavor" in throat) has been worse and more constant in the past 3 days. Does not take anything for this.   Past Medical History:  Diagnosis Date  . Hyperlipidemia   . Hypothyroid   . Labile hypertension   . Prediabetes   . Vitamin D deficiency      Allergies  Allergen Reactions  . Prednisolone     High doses make her "nervous"    Current Outpatient Medications on File Prior to Visit  Medication Sig  . aspirin 325 MG tablet Take 150 mg by mouth every other day. Taking 1/2  . Cholecalciferol (VITAMIN D PO) Take 8,000 Int'l Units by mouth daily.   . fenofibrate 160 MG tablet TAKE 1 TABLET BY MOUTH  DAILY  . levothyroxine (SYNTHROID, LEVOTHROID) 100 MCG tablet TAKE 1 TABLET BY MOUTH EVERY DAY ON AN EMPTY STOMACH FOR 30-60 MINUTES  . MAGNESIUM PO Take 400 mg by mouth daily.   . Multiple Vitamins-Minerals (ZINC PO) Take by mouth.  . Omega-3 Fatty Acids (FISH OIL PO) Take by mouth daily.  . predniSONE (  DELTASONE) 10 MG tablet 2 tablets daily for 3 days, 1 tablet daily for 4 days.  . rosuvastatin (CRESTOR) 40 MG tablet TAKE 1 TABLET BY MOUTH  EVERY DAY FOR CHOLESTEROL  . tamsulosin (FLOMAX) 0.4 MG CAPS capsule   . dexamethasone (DECADRON) 0.5 MG tablet Take 1 tab 3 x day - 3 days, then 2 x day - 3 days, then 1 tab daily (Patient not taking: Reported on 01/24/2018)  . diazepam (VALIUM) 5 MG tablet Take 1/2 to 1 tablet 3 to 4 x / day for Muscle spasms. (Patient not taking: Reported on 01/24/2018)   No current facility-administered medications on file prior to visit.     ROS: all negative except above.   Physical Exam:  BP 124/80   Pulse 76   Temp 97.7 F  (36.5 C)   Ht 5\' 11"  (1.803 m)   Wt 185 lb (83.9 kg)   SpO2 96%   BMI 25.80 kg/m   General Appearance: Well nourished, in no apparent distress. Eyes: PERRLA, EOMs, conjunctiva no swelling or erythema Sinuses: No Frontal/maxillary tenderness ENT/Mouth: Ext aud canals clear, TMs without erythema, bulging. No erythema, swelling, or exudate on post pharynx.  Tonsils not swollen or erythematous. Hearing normal.  Neck: Supple, thyroid normal.  Respiratory: Respiratory effort normal, BS equal bilaterally without rales, rhonchi, wheezing or stridor.  Cardio: RRR with no MRGs. Brisk peripheral pulses without edema.  Abdomen: Soft, + BS.  Non tender, no guarding, rebound, hernias, masses. Lymphatics: Non tender without lymphadenopathy.  Musculoskeletal: Full ROM, 5/5 strength, normal gait.  Skin: Warm, dry without rashes, lesions, ecchymosis.  Neuro: Cranial nerves intact. Normal muscle tone, no cerebellar symptoms. Sensation intact.  Psych: Awake and oriented X 3, normal affect, Insight and Judgment appropriate.     Izora Ribas, NP 4:08 PM [facility]

## 2018-01-24 ENCOUNTER — Encounter: Payer: Self-pay | Admitting: Adult Health

## 2018-01-24 ENCOUNTER — Ambulatory Visit: Payer: 59 | Admitting: Adult Health

## 2018-01-24 ENCOUNTER — Other Ambulatory Visit: Payer: Self-pay | Admitting: Adult Health

## 2018-01-24 VITALS — BP 124/80 | HR 76 | Temp 97.7°F | Ht 71.0 in | Wt 185.0 lb

## 2018-01-24 DIAGNOSIS — K219 Gastro-esophageal reflux disease without esophagitis: Secondary | ICD-10-CM

## 2018-01-24 DIAGNOSIS — Z79899 Other long term (current) drug therapy: Secondary | ICD-10-CM | POA: Diagnosis not present

## 2018-01-24 DIAGNOSIS — M791 Myalgia, unspecified site: Secondary | ICD-10-CM

## 2018-01-24 MED ORDER — RANITIDINE HCL 150 MG PO TABS: 150.0000 mg | ORAL_TABLET | Freq: Two times a day (BID) | ORAL | 1 refills | Status: AC

## 2018-01-24 NOTE — Patient Instructions (Signed)
May take ranitidine 150 mg 1-2 times a day as needed for acid reflux symptoms; best taken 1 hour prior to morning or evening meals.   Continue to hold cholesterol medication; if all labs are good, may attempt to resume next week - monitor for weakness, achiness, muscle aches and stop immediately if these occur -    Gastroesophageal Reflux Disease, Adult Normally, food travels down the esophagus and stays in the stomach to be digested. If a person has gastroesophageal reflux disease (GERD), food and stomach acid move back up into the esophagus. When this happens, the esophagus becomes sore and swollen (inflamed). Over time, GERD can make small holes (ulcers) in the lining of the esophagus. Follow these instructions at home: Diet  Follow a diet as told by your doctor. You may need to avoid foods and drinks such as: ? Coffee and tea (with or without caffeine). ? Drinks that contain alcohol. ? Energy drinks and sports drinks. ? Carbonated drinks or sodas. ? Chocolate and cocoa. ? Peppermint and mint flavorings. ? Garlic and onions. ? Horseradish. ? Spicy and acidic foods, such as peppers, chili powder, curry powder, vinegar, hot sauces, and BBQ sauce. ? Citrus fruit juices and citrus fruits, such as oranges, lemons, and limes. ? Tomato-based foods, such as red sauce, chili, salsa, and pizza with red sauce. ? Fried and fatty foods, such as donuts, french fries, potato chips, and high-fat dressings. ? High-fat meats, such as hot dogs, rib eye steak, sausage, ham, and bacon. ? High-fat dairy items, such as whole milk, butter, and cream cheese.  Eat small meals often. Avoid eating large meals.  Avoid drinking large amounts of liquid with your meals.  Avoid eating meals during the 2-3 hours before bedtime.  Avoid lying down right after you eat.  Do not exercise right after you eat. General instructions  Pay attention to any changes in your symptoms.  Take over-the-counter and  prescription medicines only as told by your doctor. Do not take aspirin, ibuprofen, or other NSAIDs unless your doctor says it is okay.  Do not use any tobacco products, including cigarettes, chewing tobacco, and e-cigarettes. If you need help quitting, ask your doctor.  Wear loose clothes. Do not wear anything tight around your waist.  Raise (elevate) the head of your bed about 6 inches (15 cm).  Try to lower your stress. If you need help doing this, ask your doctor.  If you are overweight, lose an amount of weight that is healthy for you. Ask your doctor about a safe weight loss goal.  Keep all follow-up visits as told by your doctor. This is important. Contact a doctor if:  You have new symptoms.  You lose weight and you do not know why it is happening.  You have trouble swallowing, or it hurts to swallow.  You have wheezing or a cough that keeps happening.  Your symptoms do not get better with treatment.  You have a hoarse voice. Get help right away if:  You have pain in your arms, neck, jaw, teeth, or back.  You feel sweaty, dizzy, or light-headed.  You have chest pain or shortness of breath.  You throw up (vomit) and your throw up looks like blood or coffee grounds.  You pass out (faint).  Your poop (stool) is bloody or black.  You cannot swallow, drink, or eat. This information is not intended to replace advice given to you by your health care provider. Make sure you discuss any questions you  have with your health care provider. Document Released: 04/25/2008 Document Revised: 04/14/2016 Document Reviewed: 03/04/2015 Elsevier Interactive Patient Education  [employer].

## 2018-01-25 ENCOUNTER — Other Ambulatory Visit: Payer: Self-pay | Admitting: Physician Assistant

## 2018-01-25 LAB — TIQ-MISC

## 2018-01-29 LAB — ADD ON BMP
BUN/Creatinine Ratio: 15 (calc) (ref 6–22)
BUN: 24 mg/dL (ref 7–25)
CALCIUM: 9.8 mg/dL
CO2: 28 mmol/L (ref 20–32)
Chloride: 102 mmol/L (ref 98–110)
Creat: 1.59 mg/dL — ABNORMAL HIGH
GLUCOSE: 87 mg/dL (ref 65–99)
Potassium: 4.4 mmol/L (ref 3.5–5.3)
Sodium: 139 mmol/L (ref 135–146)

## 2018-01-29 LAB — CK: Total CK: 95 U/L (ref 29–196)

## 2018-01-29 LAB — TEST AUTHORIZATION

## 2018-03-07 NOTE — Progress Notes (Signed)
FOLLOW UP  Assessment and Plan:   Hypertension Well controlled with current medications  Monitor blood pressure at home; patient to call if consistently greater than 130/80 Continue DASH diet.   Reminder to go to the ER if any CP, SOB, nausea, dizziness, severe HA, changes vision/speech, left arm numbness and tingling and jaw pain.  Cholesterol Currently above goal, has had ?myalgias with full dose crestor, but doing well on 20 mg every other day Continue low cholesterol diet and exercise.  Check lipid panel.   Prediabetes Continue diet and exercise.  Perform daily foot/skin check, notify office of any concerning changes.  Check A1C  BMI 25.38 Continue to recommend diet heavy in fruits and veggies and low in animal meats, cheeses, and dairy products, appropriate calorie intake Discuss exercise recommendations routinely Continue to monitor weight at each visit  Hypothyroidism continue medications the same pending lab results reminded to take on an empty stomach 30-22mins before food.  check TSH level  Vitamin D Def At goal at last visit; continue supplementation to maintain goal of 70-100 Defer Vit D level  Continue diet and meds as discussed. Further disposition pending results of labs. Discussed med's effects and SE's.   Over 30 minutes of exam, counseling, chart review, and critical decision making was performed.   Future Appointments  Date Time Provider [place]  03/08/2018  4:30 PM Tyler Comber, NP [facility] None  06/14/2018  9:30 AM Tyler Pinto, MD [facility] None  12/31/2018  3:00 PM Tyler Pinto, MD [facility] None    ----------------------------------------------------------------------------------------------------------------------  HPI 72 y.o. male  presents for 3 month follow up on hypertension, cholesterol, prediabetes, weight and vitamin D deficiency. He does report new cramping in right calf x 3 days when climbing stairs today; exam  unremarkable, pulses intact bilaterally. 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 He is agreeable to monitoring this and reporting if not improving in a few weeks or with new/different symptoms.  BMI is Body mass index is 25.38 kg/m., he has been working on diet and exercise. Wt Readings from Last 3 Encounters:  03/08/18 182 lb (82.6 kg)  01/24/18 185 lb (83.9 kg)  01/16/18 183 lb (83 kg)   His blood pressure has been controlled at home, today their BP is BP: 126/82  He does workout. He denies chest pain, shortness of breath, dizziness.   He is on cholesterol medication (rosuvastatin 20 mg every other day, and fenofibrate) and denies myalgias. His cholesterol is not at goal. The cholesterol last visit was:   Lab Results  Component Value Date   CHOL 170 11/30/2017   HDL 39 (L) 11/30/2017   LDLCALC 99 11/30/2017   TRIG 208 (H) 11/30/2017   CHOLHDL 4.4 11/30/2017    He has been working on diet and exercise for prediabetes, and denies foot ulcerations, increased appetite, nausea, paresthesia of the feet, polydipsia, polyuria, visual disturbances, vomiting and weight loss. Last A1C in the office was:  Lab Results  Component Value Date   HGBA1C 5.7 (H) 11/30/2017   He is on thyroid medication. His medication was not changed last visit.   Lab Results  Component Value Date   TSH 4.25 11/30/2017   Patient is on Vitamin D supplement and at goal at recent check:    Lab Results  Component Value Date   VD25OH 61 11/30/2017        Current Medications:  Current Outpatient Medications on File Prior to Visit  Medication Sig  .  aspirin 325 MG tablet Take 150 mg by mouth every other day. Taking 1/2  . Cholecalciferol (VITAMIN D PO) Take 8,000 Int'l Units by mouth daily.   . fenofibrate 160 MG tablet TAKE 1 TABLET BY MOUTH  DAILY  . levothyroxine (SYNTHROID, LEVOTHROID) 100 MCG tablet TAKE 1 TABLET BY MOUTH EVERY DAY ON AN EMPTY STOMACH FOR 30-60 MINUTES  .  MAGNESIUM PO Take 400 mg by mouth daily.   . Multiple Vitamins-Minerals (ZINC PO) Take by mouth.  . Omega-3 Fatty Acids (FISH OIL PO) Take by mouth daily.  . ranitidine (ZANTAC) 150 MG tablet Take 1 tablet (150 mg total) by mouth 2 (two) times daily.  . rosuvastatin (CRESTOR) 40 MG tablet TAKE 1 TABLET BY MOUTH  EVERY DAY FOR CHOLESTEROL  . tamsulosin (FLOMAX) 0.4 MG CAPS capsule    No current facility-administered medications on file prior to visit.     Allergies:  Allergies  Allergen Reactions  . Prednisolone     High doses make her "nervous"     Medical History:  Past Medical History:  Diagnosis Date  . Hyperlipidemia   . Hypothyroid   . Labile hypertension   . Prediabetes   . Vitamin D deficiency    Family history- Reviewed and unchanged Social history- Reviewed and unchanged   Review of Systems:  Review of Systems  Constitutional: Negative for malaise/fatigue and weight loss.  HENT: Negative for hearing loss and tinnitus.   Eyes: Negative for blurred vision and double vision.  Respiratory: Negative for cough, shortness of breath and wheezing.   Cardiovascular: Negative for chest pain, palpitations, orthopnea, claudication and leg swelling.  Gastrointestinal: Negative for abdominal pain, blood in stool, constipation, diarrhea, heartburn, melena, nausea and vomiting.  Genitourinary: Negative.   Musculoskeletal: Negative for joint pain and myalgias.  Skin: Negative for rash.  Neurological: Negative for dizziness, tingling, sensory change, weakness and headaches.  Endo/Heme/Allergies: Negative for polydipsia.  Psychiatric/Behavioral: Negative.   All other systems reviewed and are negative.   Physical Exam: BP 126/82   Pulse 67   Temp (!) 97.5 F (36.4 C)   Ht 5\' 11"  (1.803 m)   Wt 182 lb (82.6 kg)   SpO2 97%   BMI 25.38 kg/m  Wt Readings from Last 3 Encounters:  03/08/18 182 lb (82.6 kg)  01/24/18 185 lb (83.9 kg)  01/16/18 183 lb (83 kg)   General  Appearance: Well nourished, in no apparent distress. Eyes: PERRLA, EOMs, conjunctiva no swelling or erythema Sinuses: No Frontal/maxillary tenderness ENT/Mouth: Ext aud canals clear, TMs without erythema, bulging. No erythema, swelling, or exudate on post pharynx.  Tonsils not swollen or erythematous. Hearing normal.  Neck: Supple, thyroid normal.  Respiratory: Respiratory effort normal, BS equal bilaterally without rales, rhonchi, wheezing or stridor.  Cardio: RRR with no MRGs. Brisk peripheral pulses without edema.  Abdomen: Soft, + BS.  Non tender, no guarding, rebound, hernias, masses. Lymphatics: Non tender without lymphadenopathy.  Musculoskeletal: Full ROM, 5/5 strength, Normal gait Skin: Warm, dry without rashes, lesions, ecchymosis.  Neuro: Cranial nerves intact. No cerebellar symptoms.  Psych: Awake and oriented X 3, normal affect, Insight and Judgment appropriate.    Tyler Ribas, NP 4:11 PM [facility]

## 2018-03-08 ENCOUNTER — Encounter: Payer: Self-pay | Admitting: Adult Health

## 2018-03-08 ENCOUNTER — Ambulatory Visit: Payer: 59 | Admitting: Adult Health

## 2018-03-08 VITALS — BP 126/82 | HR 67 | Temp 97.5°F | Ht 71.0 in | Wt 182.0 lb

## 2018-03-08 DIAGNOSIS — E559 Vitamin D deficiency, unspecified: Secondary | ICD-10-CM

## 2018-03-08 DIAGNOSIS — Z79899 Other long term (current) drug therapy: Secondary | ICD-10-CM | POA: Diagnosis not present

## 2018-03-08 DIAGNOSIS — R0989 Other specified symptoms and signs involving the circulatory and respiratory systems: Secondary | ICD-10-CM

## 2018-03-08 DIAGNOSIS — R7303 Prediabetes: Secondary | ICD-10-CM | POA: Diagnosis not present

## 2018-03-08 DIAGNOSIS — E039 Hypothyroidism, unspecified: Secondary | ICD-10-CM

## 2018-03-08 DIAGNOSIS — E782 Mixed hyperlipidemia: Secondary | ICD-10-CM

## 2018-03-08 DIAGNOSIS — Z6825 Body mass index (BMI) 25.0-25.9, adult: Secondary | ICD-10-CM

## 2018-03-08 NOTE — Patient Instructions (Signed)
Try calf stretches, monitor calf cramping symptoms - let us know if anything new/different, if there are abnormalities on labs (electrolytes) we will let you know.     Leg Cramps Leg cramps occur when a muscle or muscles tighten and you have no control over this tightening (involuntary muscle contraction). Muscle cramps can develop in any muscle, but the most common place is in the calf muscles of the leg. Those cramps can occur during exercise or when you are at rest. Leg cramps are painful, and they may last for a few seconds to a few minutes. Cramps may return several times before they finally stop. Usually, leg cramps are not caused by a serious medical problem. In many cases, the cause is not known. Some common causes include:  Overexertion.  Overuse from repetitive motions, or doing the same thing over and over.  Remaining in a certain position for a long period of time.  Improper preparation, form, or technique while performing a sport or an activity.  Dehydration.  Injury.  Side effects of some medicines.  Abnormally low levels of the salts and ions in your blood (electrolytes), especially potassium and calcium. These levels could be low if you are taking water pills (diuretics) or if you are pregnant.  Follow these instructions at home: Watch your condition for any changes. Taking the following actions may help to lessen any discomfort that you are feeling:  Stay well-hydrated. Drink enough fluid to keep your urine clear or pale yellow.  Try massaging, stretching, and relaxing the affected muscle. Do this for several minutes at a time.  For tight or tense muscles, use a warm towel, heating pad, or hot shower water directed to the affected area.  If you are sore or have pain after a cramp, applying ice to the affected area may relieve discomfort. ? Put ice in a plastic bag. ? Place a towel between your skin and the bag. ? Leave the ice on for 20 minutes, 2-3 times per  day.  Avoid strenuous exercise for several days if you have been having frequent leg cramps.  Make sure that your diet includes the essential minerals for your muscles to work normally.  Take medicines only as directed by your health care provider.  Contact a health care provider if:  Your leg cramps get more severe or more frequent, or they do not improve over time.  Your foot becomes cold, numb, or blue. This information is not intended to replace advice given to you by your health care provider. Make sure you discuss any questions you have with your health care provider. Document Released: 12/15/2004 Document Revised: 04/14/2016 Document Reviewed: 10/15/2014 Elsevier Interactive Patient Education  [employer].

## 2018-03-09 LAB — COMPLETE METABOLIC PANEL WITH GFR
AG Ratio: 1.8 (calc) (ref 1.0–2.5)
ALKALINE PHOSPHATASE (APISO): 50 U/L (ref 40–115)
ALT: 21 U/L (ref 9–46)
AST: 26 U/L (ref 10–35)
Albumin: 4.6 g/dL (ref 3.6–5.1)
BUN/Creatinine Ratio: 13 (calc) (ref 6–22)
BUN: 22 mg/dL (ref 7–25)
CALCIUM: 9.8 mg/dL (ref 8.6–10.3)
CO2: 27 mmol/L (ref 20–32)
Chloride: 103 mmol/L (ref 98–110)
Creat: 1.65 mg/dL — ABNORMAL HIGH (ref 0.70–1.18)
GFR, EST NON AFRICAN AMERICAN: 41 mL/min/{1.73_m2} — AB (ref >=60)
GFR, Est African American: 48 mL/min/{1.73_m2} — ABNORMAL LOW (ref >=60)
GLUCOSE: 88 mg/dL (ref 65–99)
Globulin: 2.6 g/dL (calc) (ref 1.9–3.7)
Potassium: 4.1 mmol/L (ref 3.5–5.3)
Sodium: 139 mmol/L (ref 135–146)
Total Bilirubin: 0.5 mg/dL (ref 0.2–1.2)
Total Protein: 7.2 g/dL (ref 6.1–8.1)

## 2018-03-09 LAB — CBC WITH DIFFERENTIAL/PLATELET
BASOS PCT: 0.7 %
Basophils Absolute: 49 cells/uL (ref 0–200)
EOS ABS: 98 {cells}/uL (ref 15–500)
Eosinophils Relative: 1.4 %
HCT: 43.6 % (ref 38.5–50.0)
Hemoglobin: 15 g/dL (ref 13.2–17.1)
Lymphs Abs: 1967 cells/uL (ref 850–3900)
MCH: 30.2 pg (ref 27.0–33.0)
MCHC: 34.4 g/dL (ref 32.0–36.0)
MCV: 87.7 fL (ref 80.0–100.0)
MONOS PCT: 10.8 %
MPV: 10.7 fL (ref 7.5–12.5)
NEUTROS PCT: 59 %
Neutro Abs: 4130 cells/uL (ref 1500–7800)
PLATELETS: 262 10*3/uL (ref 140–400)
RBC: 4.97 10*6/uL (ref 4.20–5.80)
RDW: 12.5 % (ref 11.0–15.0)
TOTAL LYMPHOCYTE: 28.1 %
WBC mixed population: 756 cells/uL (ref 200–950)
WBC: 7 10*3/uL (ref 3.8–10.8)

## 2018-03-09 LAB — LIPID PANEL
CHOL/HDL RATIO: 3.9 (calc) (ref <5.0)
CHOLESTEROL: 169 mg/dL (ref <200)
HDL: 43 mg/dL (ref >40)
LDL CHOLESTEROL (CALC): 104 mg/dL — AB
NON-HDL CHOLESTEROL (CALC): 126 mg/dL (ref <130)
TRIGLYCERIDES: 127 mg/dL (ref <150)

## 2018-03-09 LAB — HEMOGLOBIN A1C
EAG (MMOL/L): 6.6 (calc)
Hgb A1c MFr Bld: 5.8 % of total Hgb — ABNORMAL HIGH (ref <5.7)
Mean Plasma Glucose: 120 (calc)

## 2018-03-09 LAB — TSH: TSH: 3.32 m[IU]/L (ref 0.40–4.50)

## 2018-03-25 ENCOUNTER — Other Ambulatory Visit: Payer: Self-pay | Admitting: Internal Medicine

## 2018-06-14 ENCOUNTER — Ambulatory Visit: Payer: Self-pay | Admitting: Internal Medicine

## 2018-06-28 ENCOUNTER — Ambulatory Visit: Payer: Self-pay | Admitting: Internal Medicine

## 2018-07-04 ENCOUNTER — Ambulatory Visit: Payer: Self-pay | Admitting: Internal Medicine

## 2018-08-02 ENCOUNTER — Other Ambulatory Visit: Payer: Self-pay | Admitting: Internal Medicine

## 2018-08-16 ENCOUNTER — Encounter: Payer: Self-pay | Admitting: Internal Medicine

## 2018-08-16 ENCOUNTER — Ambulatory Visit (INDEPENDENT_AMBULATORY_CARE_PROVIDER_SITE_OTHER): Payer: 59 | Admitting: Internal Medicine

## 2018-08-16 VITALS — BP 126/84 | HR 60 | Temp 97.5°F | Resp 16 | Ht 71.0 in | Wt 183.4 lb

## 2018-08-16 DIAGNOSIS — R7303 Prediabetes: Secondary | ICD-10-CM

## 2018-08-16 DIAGNOSIS — E039 Hypothyroidism, unspecified: Secondary | ICD-10-CM

## 2018-08-16 DIAGNOSIS — E782 Mixed hyperlipidemia: Secondary | ICD-10-CM | POA: Diagnosis not present

## 2018-08-16 DIAGNOSIS — E559 Vitamin D deficiency, unspecified: Secondary | ICD-10-CM

## 2018-08-16 DIAGNOSIS — Z79899 Other long term (current) drug therapy: Secondary | ICD-10-CM

## 2018-08-16 DIAGNOSIS — R7309 Other abnormal glucose: Secondary | ICD-10-CM | POA: Diagnosis not present

## 2018-08-16 DIAGNOSIS — K219 Gastro-esophageal reflux disease without esophagitis: Secondary | ICD-10-CM

## 2018-08-16 DIAGNOSIS — R0989 Other specified symptoms and signs involving the circulatory and respiratory systems: Secondary | ICD-10-CM

## 2018-08-16 NOTE — Patient Instructions (Signed)

## 2018-08-16 NOTE — Progress Notes (Signed)
This very nice 72 y.o. MWM presents for 6 month follow up with HTN, HLD, Pre-Diabetes, Hypothyroidism and Vitamin D Deficiency.  Patient has GERD controlled w/diet & his meds.      Patient relates plans to retire & move to [place] in the near future closer to his gravid daughter.      Patient is treated for HTN (2008) & BP has been controlled at home. Today's BP is at goal - 126/84. Patient has had no complaints of any cardiac type chest pain, palpitations, dyspnea / orthopnea / PND, dizziness, claudication, or dependent edema.     Hyperlipidemia is controlled with diet & Crestor/fenofibrate. Patient denies myalgias or other med SE's. Current Lipids are at goal: Lab Results  Component Value Date   CHOL 163 08/16/2018   HDL 47 08/16/2018   LDLCALC 91 08/16/2018   TRIG 150 (H) 08/16/2018   CHOLHDL 3.5 08/16/2018      Also, the patient has history of PreDiabetes  (A1c 5.8%/2012 & 6.1%/2014)  and has had no symptoms of reactive hypoglycemia, diabetic polys, paresthesias or visual blurring.  Current A1c is not at goal: Lab Results  Component Value Date   HGBA1C 5.8 (H) 08/16/2018      Patient has been on Thyroid Replacement since 2008.      Further, the patient also has history of Vitamin D Deficiency ("30"/2008)  and supplements vitamin D without any suspected side-effects. Current vitamin D is near goal (70-100):  Lab Results  Component Value Date   VD25OH 55 08/16/2018   Current Outpatient Medications on File Prior to Visit  Medication Sig  . aspirin 325 MG tablet Take 150 mg by mouth every other day. Taking 1/2  . Cholecalciferol (VITAMIN D PO) Take 8,000 Int'l Units by mouth daily.   . fenofibrate 160 MG tablet TAKE 1 TABLET BY MOUTH  DAILY  . levothyroxine (SYNTHROID, LEVOTHROID) 100 MCG tablet TAKE 1 TABLET BY MOUTH EVERY DAY ON AN EMPTY STOMACH FOR 30-60 MINUTES  . MAGNESIUM PO Take 400 mg by mouth daily.   . Multiple Vitamins-Minerals (ZINC PO) Take by mouth.  . Omega-3  Fatty Acids (FISH OIL PO) Take by mouth daily.  . ranitidine (ZANTAC) 150 MG tablet Take 1 tablet (150 mg total) by mouth 2 (two) times daily.  . rosuvastatin (CRESTOR) 40 MG tablet TAKE 1 TABLET BY MOUTH  EVERY DAY FOR CHOLESTEROL  . tamsulosin (FLOMAX) 0.4 MG CAPS capsule    No current facility-administered medications on file prior to visit.    Allergies  Allergen Reactions  . Prednisolone     High doses make her "nervous"   PMHx:   Past Medical History:  Diagnosis Date  . Hyperlipidemia   . Hypothyroid   . Labile hypertension   . Prediabetes   . Vitamin D deficiency    Immunization History  Administered Date(s) Administered  . Influenza-Unspecified 08/31/2016  . PPD Test 02/06/2014, 05/04/2015  . Pneumococcal Conjugate-13 11/30/2017  . Pneumococcal Polysaccharide-23 01/26/2012  . Tdap 12/04/2008  . Zoster 11/07/2013   Past Surgical History:  Procedure Laterality Date  . COLONOSCOPY  2010  . [place] and 1995   S1 and L4  . ORTHOPEDIC SURGERY Left 1991   left disc in [place]  . OTHER SURGICAL HISTORY     Tonsillectomy bilateral at age 49  . POLYPECTOMY  2010   FHx:    Reviewed / unchanged  SHx:    Reviewed / unchanged  Systems Review:  Constitutional: Denies fever, chills, wt changes, headaches, insomnia, fatigue, night sweats, change in appetite. Eyes: Denies redness, blurred vision, diplopia, discharge, itchy, watery eyes.  ENT: Denies discharge, congestion, post nasal drip, epistaxis, sore throat, earache, hearing loss, dental pain, tinnitus, vertigo, sinus pain, snoring.  CV: Denies chest pain, palpitations, irregular heartbeat, syncope, dyspnea, diaphoresis, orthopnea, PND, claudication or edema. Respiratory: denies cough, dyspnea, DOE, pleurisy, hoarseness, laryngitis, wheezing.  Gastrointestinal: Denies dysphagia, odynophagia, heartburn, reflux, water brash, abdominal pain or cramps, nausea, vomiting, bloating, diarrhea, constipation,  hematemesis, melena, hematochezia  or hemorrhoids. Genitourinary: Denies dysuria, frequency, urgency, nocturia, hesitancy, discharge, hematuria or flank pain. Musculoskeletal: Denies arthralgias, myalgias, stiffness, jt. swelling, pain, limping or strain/sprain.  Skin: Denies pruritus, rash, hives, warts, acne, eczema or change in skin lesion(s). Neuro: No weakness, tremor, incoordination, spasms, paresthesia or pain. Psychiatric: Denies confusion, memory loss or sensory loss. Endo: Denies change in weight, skin or hair change.  Heme/Lymph: No excessive bleeding, bruising or enlarged lymph nodes.  Physical Exam  BP 126/84   Pulse 60   Temp (!) 97.5 F (36.4 C)   Resp 16   Ht 5\' 11"  (1.803 m)   Wt 183 lb 6.4 oz (83.2 kg)   BMI 25.58 kg/m   Appears  well nourished, well groomed  and in no distress.  Eyes: PERRLA, EOMs, conjunctiva no swelling or erythema. Sinuses: No frontal/maxillary tenderness ENT/Mouth: EAC's clear, TM's nl w/o erythema, bulging. Nares clear w/o erythema, swelling, exudates. Oropharynx clear without erythema or exudates. Oral hygiene is good. Tongue normal, non obstructing. Hearing intact.  Neck: Supple. Thyroid not palpable. Car 2+/2+ without bruits, nodes or JVD. Chest: Respirations nl with BS clear & equal w/o rales, rhonchi, wheezing or stridor.  Cor: Heart sounds normal w/ regular rate and rhythm without sig. murmurs, gallops, clicks or rubs. Peripheral pulses normal and equal  without edema.  Abdomen: Soft & bowel sounds normal. Non-tender w/o guarding, rebound, hernias, masses or organomegaly.  Lymphatics: Unremarkable.  Musculoskeletal: Full ROM all peripheral extremities, joint stability, 5/5 strength and normal gait.  Skin: Warm, dry without exposed rashes, lesions or ecchymosis apparent.  Neuro: Cranial nerves intact, reflexes equal bilaterally. Sensory-motor testing grossly intact. Tendon reflexes grossly intact.  Pysch: Alert & oriented x 3.  Insight  and judgement nl & appropriate. No ideations.  Assessment and Plan:  1. Labile hypertension  - Continue medication, monitor blood pressure at home.  - Continue DASH diet.  Reminder to go to the ER if any CP,  SOB, nausea, dizziness, severe HA, changes vision/speech.  - CBC with Differential/Platelet - COMPLETE METABOLIC PANEL WITH GFR - Magnesium - TSH  2. Hyperlipidemia, mixed  - Continue diet/meds, exercise,& lifestyle modifications.  - Continue monitor periodic cholesterol/liver & renal functions   - Lipid panel - TSH  3. Abnormal glucose  - Continue diet, exercise, lifestyle modifications.  - Monitor appropriate labs.  - Hemoglobin A1c - Insulin, random  4. Vitamin D deficiency  - Continue supplementation.  - VITAMIN D 25 Hydroxyl  5. Prediabetes  - Hemoglobin A1c - Insulin, random  6. Hypothyroidism  - TSH  7. Gastroesophageal reflux disease  - CBC with Differential/Platelet  8. Medication management  - CBC with Differential/Platelet - COMPLETE METABOLIC PANEL WITH GFR - Magnesium - Lipid panel - TSH - Hemoglobin A1c - Insulin, random - VITAMIN D 25 Hydroxyl        Discussed  regular exercise, BP monitoring, weight control to achieve/maintain BMI less than 25 and discussed  med and SE's. Recommended labs to assess and monitor clinical status with further disposition pending results of labs. Over 30 minutes of exam, counseling, chart review was performed.

## 2018-08-17 LAB — COMPLETE METABOLIC PANEL WITH GFR
AG RATIO: 1.8 (calc) (ref 1.0–2.5)
ALKALINE PHOSPHATASE (APISO): 49 U/L (ref 40–115)
ALT: 25 U/L (ref 9–46)
AST: 28 U/L (ref 10–35)
Albumin: 4.8 g/dL (ref 3.6–5.1)
BILIRUBIN TOTAL: 0.5 mg/dL (ref 0.2–1.2)
BUN/Creatinine Ratio: 13 (calc) (ref 6–22)
BUN: 22 mg/dL (ref 7–25)
CHLORIDE: 101 mmol/L (ref 98–110)
CO2: 30 mmol/L (ref 20–32)
Calcium: 10.2 mg/dL (ref 8.6–10.3)
Creat: 1.67 mg/dL — ABNORMAL HIGH (ref 0.70–1.18)
GFR, Est African American: 47 mL/min/{1.73_m2} — ABNORMAL LOW (ref >=60)
GFR, Est Non African American: 41 mL/min/{1.73_m2} — ABNORMAL LOW (ref >=60)
GLUCOSE: 87 mg/dL (ref 65–99)
Globulin: 2.6 g/dL (calc) (ref 1.9–3.7)
POTASSIUM: 4 mmol/L (ref 3.5–5.3)
Sodium: 138 mmol/L (ref 135–146)
Total Protein: 7.4 g/dL (ref 6.1–8.1)

## 2018-08-17 LAB — CBC WITH DIFFERENTIAL/PLATELET
BASOS ABS: 50 {cells}/uL (ref 0–200)
Basophils Relative: 0.6 %
EOS ABS: 126 {cells}/uL (ref 15–500)
EOS PCT: 1.5 %
HCT: 43.5 % (ref 38.5–50.0)
Hemoglobin: 14.8 g/dL (ref 13.2–17.1)
Lymphs Abs: 2016 cells/uL (ref 850–3900)
MCH: 30 pg (ref 27.0–33.0)
MCHC: 34 g/dL (ref 32.0–36.0)
MCV: 88.1 fL (ref 80.0–100.0)
MPV: 10.3 fL (ref 7.5–12.5)
Monocytes Relative: 10.6 %
Neutro Abs: 5317 cells/uL (ref 1500–7800)
Neutrophils Relative %: 63.3 %
PLATELETS: 279 10*3/uL (ref 140–400)
RBC: 4.94 10*6/uL (ref 4.20–5.80)
RDW: 12.6 % (ref 11.0–15.0)
TOTAL LYMPHOCYTE: 24 %
WBC mixed population: 890 cells/uL (ref 200–950)
WBC: 8.4 10*3/uL (ref 3.8–10.8)

## 2018-08-17 LAB — LIPID PANEL
CHOLESTEROL: 163 mg/dL (ref <200)
HDL: 47 mg/dL (ref >40)
LDL Cholesterol (Calc): 91 mg/dL (calc)
NON-HDL CHOLESTEROL (CALC): 116 mg/dL (ref <130)
TRIGLYCERIDES: 150 mg/dL — AB (ref <150)
Total CHOL/HDL Ratio: 3.5 (calc) (ref <5.0)

## 2018-08-17 LAB — TSH: TSH: 3.17 mIU/L (ref 0.40–4.50)

## 2018-08-17 LAB — INSULIN, RANDOM: INSULIN: 14.4 u[IU]/mL (ref 2.0–19.6)

## 2018-08-17 LAB — MAGNESIUM: MAGNESIUM: 2 mg/dL (ref 1.5–2.5)

## 2018-08-17 LAB — HEMOGLOBIN A1C
HEMOGLOBIN A1C: 5.8 %{Hb} — AB (ref <5.7)
Mean Plasma Glucose: 120 (calc)
eAG (mmol/L): 6.6 (calc)

## 2018-08-17 LAB — VITAMIN D 25 HYDROXY (VIT D DEFICIENCY, FRACTURES): VIT D 25 HYDROXY: 55 ng/mL (ref 30–100)

## 2018-10-11 ENCOUNTER — Other Ambulatory Visit: Payer: Self-pay | Admitting: Physician Assistant

## 2018-12-31 ENCOUNTER — Encounter: Payer: Self-pay | Admitting: Internal Medicine

## 2018-12-31 NOTE — Progress Notes (Signed)
[facility]   Unk Pinto, M.D.     Tyler Gilbert. Tyler Gilbert, P.A.-C Liane Comber, [place]                [address] [place] [address] Telephone [phone] Telefax [phone] Annual  Screening/Preventative Visit  & Comprehensive Evaluation & Examination     This very nice 73 y.o. MWM  presents for a Screening /Preventative Visit & comprehensive evaluation and management of multiple medical co-morbidities.  Patient has been followed for HTN, HLD,  Prediabetes, Hypothyroidism  and Vitamin D Deficiency.     HTN predates since 2008. Patient's BP has been controlled at home.  Today's BP is at goal - 120/78. Patient denies any cardiac symptoms as chest pain, palpitations, shortness of breath, dizziness or ankle swelling.     Patient's hyperlipidemia is controlled with diet and medications. Patient denies myalgias or other medication SE's. Last lipids were at goal: Lab Results  Component Value Date   CHOL 163 08/16/2018   HDL 47 08/16/2018   LDLCALC 91 08/16/2018   TRIG 150 (H) 08/16/2018   CHOLHDL 3.5 08/16/2018      Patient has hx/o prediabetes (A1c 5.8% / 2012 & 6.1% / 2014) and patient denies reactive hypoglycemic symptoms, visual blurring, diabetic polys or paresthesias. Last A1c was not at goal: Lab Results  Component Value Date   HGBA1C 5.8 (H) 08/16/2018       Patient was initiated on Thyroid Replacement in 2008.     Finally, patient has history of Vitamin D Deficiency ("30"/ 2008)  and last vitamin D was near goal (70-100): Lab Results  Component Value Date   VD25OH 55 08/16/2018   Current Outpatient Medications on File Prior to Visit  Medication Sig  . aspirin 325 MG tablet Take 150 mg by mouth every other day. Taking 1/2  . Cholecalciferol (VITAMIN D PO) Take 8,000 Int'l Units by mouth daily.   . fenofibrate 160 MG tablet TAKE 1 TABLET BY MOUTH  DAILY  .  levothyroxine (SYNTHROID, LEVOTHROID) 100 MCG tablet TAKE 1 TABLET BY MOUTH EVERY DAY ON AN EMPTY STOMACH FOR 30-60 MINUTES  . MAGNESIUM PO Take 400 mg by mouth daily.   . Multiple Vitamins-Minerals (ZINC PO) Take by mouth.  . Omega-3 Fatty Acids (FISH OIL PO) Take by mouth daily.  . ranitidine (ZANTAC) 150 MG tablet Take 1 tablet (150 mg total) by mouth 2 (two) times daily.  . rosuvastatin (CRESTOR) 40 MG tablet TAKE 1 TABLET BY MOUTH  EVERY DAY FOR CHOLESTEROL  . tamsulosin (FLOMAX) 0.4 MG CAPS capsule    No current facility-administered medications on file prior to visit.    Allergies  Allergen Reactions  . Prednisolone     High doses make her "nervous"   Past Medical History:  Diagnosis Date  . Hyperlipidemia   . Hypothyroid   . Labile hypertension   . Prediabetes   . Vitamin D deficiency    Health Maintenance  Topic Date Due  . Hepatitis C Screening  [birth year]  . INFLUENZA VACCINE  06/21/2018  . PNA vac Low Risk Adult (2 of 2 - PPSV23) 11/30/2018  . TETANUS/TDAP  12/04/2018  . COLONOSCOPY  07/02/2020   Immunization History  Administered Date(s) Administered  . Influenza-Unspecified 08/31/2016  . PPD Test 02/06/2014, 05/04/2015  . Pneumococcal Conjugate-13 11/30/2017  . Pneumococcal Polysaccharide-23 01/26/2012  .  Tdap 12/04/2008  . Zoster 11/07/2013   Last Colon -  07/03/2015 - adenomatous polyp & recc 5 yr f/u due Aug 2021   Past Surgical History:  Procedure Laterality Date  . COLONOSCOPY  2010  . [place] and 1995   S1 and L4  . ORTHOPEDIC SURGERY Left 1991   left disc in [place]  . OTHER SURGICAL HISTORY     Tonsillectomy bilateral at age 76  . POLYPECTOMY  2010   Family History  Problem Relation Age of Onset  . Diverticulosis Mother   . Cancer Mother   . Lymphoma Mother   . Heart disease Father   . Heart attack Father   . Colitis Brother   . Esophageal cancer Neg Hx   . Rectal cancer Neg Hx   . Stomach cancer Neg Hx   . Colon cancer  Maternal Grandmother    Social History   Socioeconomic History  . Marital status: Married    Spouse name: Not on file  . Number of children: Not on file  . Years of education: Not on file  . Highest education level: Not on file  Occupational History  . Retired Press photographer  Tobacco Use  . Smoking status: Never Smoker  . Smokeless tobacco: Never Used  Substance and Sexual Activity  . Alcohol use: No    Alcohol/week: 0.0 standard drinks  . Drug use: No  . Sexual activity: Not on file    ROS Constitutional: Denies fever, chills, weight loss/gain, headaches, insomnia,  night sweats or change in appetite. Does c/o fatigue. Eyes: Denies redness, blurred vision, diplopia, discharge, itchy or watery eyes.  ENT: Denies discharge, congestion, post nasal drip, epistaxis, sore throat, earache, hearing loss, dental pain, Tinnitus, Vertigo, Sinus pain or snoring.  Cardio: Denies chest pain, palpitations, irregular heartbeat, syncope, dyspnea, diaphoresis, orthopnea, PND, claudication or edema Respiratory: denies cough, dyspnea, DOE, pleurisy, hoarseness, laryngitis or wheezing.  Gastrointestinal: Denies dysphagia, heartburn, reflux, water brash, pain, cramps, nausea, vomiting, bloating, diarrhea, constipation, hematemesis, melena, hematochezia, jaundice or hemorrhoids Genitourinary: Denies dysuria, frequency, urgency, nocturia, hesitancy, discharge, hematuria or flank pain Musculoskeletal: Denies arthralgia, myalgia, stiffness, Jt. Swelling, pain, limp or strain/sprain. Denies Falls. Skin: Denies puritis, rash, hives, warts, acne, eczema or change in skin lesion Neuro: No weakness, tremor, incoordination, spasms, paresthesia or pain Psychiatric: Denies confusion, memory loss or sensory loss. Denies Depression. Endocrine: Denies change in weight, skin, hair change, nocturia, and paresthesia, diabetic polys, visual blurring or hyper / hypo glycemic episodes.  Heme/Lymph: No excessive bleeding, bruising or  enlarged lymph nodes.  Physical Exam  BP 120/78   Pulse 60   Temp 98.3 F (36.8 C)   Ht 5' 11.5" (1.816 m)   Wt 183 lb 9.6 oz (83.3 kg)   SpO2 96%   BMI 25.25 kg/m   General Appearance: Well nourished and well groomed and in no apparent distress.  Eyes: PERRLA, EOMs, conjunctiva no swelling or erythema, normal fundi and vessels. Sinuses: No frontal/maxillary tenderness ENT/Mouth: EACs patent / TMs  nl. Nares clear without erythema, swelling, mucoid exudates. Oral hygiene is good. No erythema, swelling, or exudate. Tongue normal, non-obstructing. Tonsils not swollen or erythematous. Hearing normal.  Neck: Supple, thyroid not palpable. No bruits, nodes or JVD. Respiratory: Respiratory effort normal.  BS equal and clear bilateral without rales, rhonci, wheezing or stridor. Cardio: Heart sounds are normal with regular rate and rhythm and no murmurs, rubs or gallops. Peripheral pulses are normal and equal bilaterally without edema. No  aortic or femoral bruits. Chest: symmetric with normal excursions and percussion.  Abdomen: Soft, with Nl bowel sounds. Nontender, no guarding, rebound, hernias, masses, or organomegaly.  Lymphatics: Non tender without lymphadenopathy.  Genitourinary: No hernias.Testes nl. DRE - prostate nl for age - smooth & firm w/o nodules. Musculoskeletal: Full ROM all peripheral extremities, joint stability, 5/5 strength, and normal gait. Skin: Warm and dry without rashes, lesions, cyanosis, clubbing or  ecchymosis.  Neuro: Cranial nerves intact, reflexes equal bilaterally. Normal muscle tone, no cerebellar symptoms. Sensation intact.  Pysch: Alert and oriented X 3 with normal affect, insight and judgment appropriate.   Assessment and Plan  1. Annual Preventative/Screening Exam   2. Labile hypertension  - EKG 12-Lead - [place], RETROPERITNL ABD,  LTD - Urinalysis, Routine w reflex microscopic - Microalbumin / creatinine urine ratio - CBC with Differential/Platelet -  COMPLETE METABOLIC PANEL WITH GFR - TSH  3. Hyperlipidemia, mixed  - EKG 12-Lead - [place], RETROPERITNL ABD,  LTD - Lipid panel - TSH  4. Abnormal glucose  - EKG 12-Lead - [place], RETROPERITNL ABD,  LTD - Hemoglobin A1c - Insulin, random  5. Vitamin D deficiency  - VITAMIN D 25 Hydroxyl  6. Hypothyroidism  - TSH  7. Gastroesophageal reflux disease  - CBC with Differential/Platelet  8. Prediabetes  - Hemoglobin A1c - Insulin, random  9. Screening for colorectal cancer  - POC Hemoccult Bld/Stl   10. Prostate cancer screening  - PSA  11. 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 Screening for ischemic heart disease  - EKG 12-Lead  14. FHx: heart disease  - EKG 12-Lead - [place], RETROPERITNL ABD,  LTD  15. Screening for AAA (aortic abdominal aneurysm)  - [place], RETROPERITNL ABD,  LTD  16. Medication management  - Urinalysis, Routine w reflex microscopic - Microalbumin / creatinine urine ratio - CBC with Differential/Platelet - COMPLETE METABOLIC PANEL WITH GFR - Magnesium - Lipid panel - TSH - Hemoglobin A1c - Insulin, random - VITAMIN D 25 Hydroxyl       Patient was counseled in prudent diet, weight control to achieve/maintain BMI less than 25, BP monitoring, regular exercise and medications as discussed.  Discussed med effects and SE's. Routine screening labs and tests as requested with regular follow-up as recommended. Over 40 minutes of exam, counseling, chart review and high complex critical decision making was performed

## 2018-12-31 NOTE — Patient Instructions (Signed)

## 2019-01-01 ENCOUNTER — Ambulatory Visit (INDEPENDENT_AMBULATORY_CARE_PROVIDER_SITE_OTHER): Payer: 59 | Admitting: Internal Medicine

## 2019-01-01 ENCOUNTER — Encounter: Payer: Self-pay | Admitting: Internal Medicine

## 2019-01-01 VITALS — BP 120/78 | HR 60 | Temp 98.3°F | Ht 71.5 in | Wt 183.6 lb

## 2019-01-01 DIAGNOSIS — E559 Vitamin D deficiency, unspecified: Secondary | ICD-10-CM

## 2019-01-01 DIAGNOSIS — N401 Enlarged prostate with lower urinary tract symptoms: Secondary | ICD-10-CM

## 2019-01-01 DIAGNOSIS — Z136 Encounter for screening for cardiovascular disorders: Secondary | ICD-10-CM

## 2019-01-01 DIAGNOSIS — E782 Mixed hyperlipidemia: Secondary | ICD-10-CM

## 2019-01-01 DIAGNOSIS — Z79899 Other long term (current) drug therapy: Secondary | ICD-10-CM

## 2019-01-01 DIAGNOSIS — Z1211 Encounter for screening for malignant neoplasm of colon: Secondary | ICD-10-CM

## 2019-01-01 DIAGNOSIS — R7309 Other abnormal glucose: Secondary | ICD-10-CM

## 2019-01-01 DIAGNOSIS — R0989 Other specified symptoms and signs involving the circulatory and respiratory systems: Secondary | ICD-10-CM

## 2019-01-01 DIAGNOSIS — Z8249 Family history of ischemic heart disease and other diseases of the circulatory system: Secondary | ICD-10-CM | POA: Diagnosis not present

## 2019-01-01 DIAGNOSIS — N138 Other obstructive and reflux uropathy: Secondary | ICD-10-CM

## 2019-01-01 DIAGNOSIS — R7303 Prediabetes: Secondary | ICD-10-CM

## 2019-01-01 DIAGNOSIS — Z1212 Encounter for screening for malignant neoplasm of rectum: Secondary | ICD-10-CM

## 2019-01-01 DIAGNOSIS — Z0001 Encounter for general adult medical examination with abnormal findings: Secondary | ICD-10-CM

## 2019-01-01 DIAGNOSIS — Z0000 Encounter for general adult medical examination without abnormal findings: Secondary | ICD-10-CM

## 2019-01-01 DIAGNOSIS — I10 Essential (primary) hypertension: Secondary | ICD-10-CM | POA: Diagnosis not present

## 2019-01-01 DIAGNOSIS — Z125 Encounter for screening for malignant neoplasm of prostate: Secondary | ICD-10-CM

## 2019-01-01 DIAGNOSIS — R972 Elevated prostate specific antigen [PSA]: Secondary | ICD-10-CM

## 2019-01-01 DIAGNOSIS — K219 Gastro-esophageal reflux disease without esophagitis: Secondary | ICD-10-CM

## 2019-01-01 DIAGNOSIS — E039 Hypothyroidism, unspecified: Secondary | ICD-10-CM

## 2019-01-02 LAB — CBC WITH DIFFERENTIAL/PLATELET
ABSOLUTE MONOCYTES: 791 {cells}/uL (ref 200–950)
BASOS PCT: 0.6 %
Basophils Absolute: 51 cells/uL (ref 0–200)
Eosinophils Absolute: 153 cells/uL (ref 15–500)
Eosinophils Relative: 1.8 %
HEMATOCRIT: 42 % (ref 38.5–50.0)
Hemoglobin: 14.5 g/dL (ref 13.2–17.1)
LYMPHS ABS: 1964 {cells}/uL (ref 850–3900)
MCH: 31 pg (ref 27.0–33.0)
MCHC: 34.5 g/dL (ref 32.0–36.0)
MCV: 89.7 fL (ref 80.0–100.0)
MPV: 10.7 fL (ref 7.5–12.5)
Monocytes Relative: 9.3 %
NEUTROS ABS: 5542 {cells}/uL (ref 1500–7800)
Neutrophils Relative %: 65.2 %
PLATELETS: 276 10*3/uL (ref 140–400)
RBC: 4.68 10*6/uL (ref 4.20–5.80)
RDW: 12.3 % (ref 11.0–15.0)
TOTAL LYMPHOCYTE: 23.1 %
WBC: 8.5 10*3/uL (ref 3.8–10.8)

## 2019-01-02 LAB — LIPID PANEL
Cholesterol: 162 mg/dL (ref <200)
HDL: 42 mg/dL (ref >=40)
LDL Cholesterol (Calc): 90 mg/dL (calc)
Non-HDL Cholesterol (Calc): 120 mg/dL (calc) (ref <130)
Total CHOL/HDL Ratio: 3.9 (calc) (ref <5.0)
Triglycerides: 209 mg/dL — ABNORMAL HIGH (ref <150)

## 2019-01-02 LAB — COMPLETE METABOLIC PANEL WITH GFR
AG Ratio: 1.7 (calc) (ref 1.0–2.5)
ALT: 18 U/L (ref 9–46)
AST: 22 U/L (ref 10–35)
Albumin: 4.7 g/dL (ref 3.6–5.1)
Alkaline phosphatase (APISO): 53 U/L (ref 35–144)
BUN/Creatinine Ratio: 15 (calc) (ref 6–22)
BUN: 20 mg/dL (ref 7–25)
CO2: 27 mmol/L (ref 20–32)
CREATININE: 1.36 mg/dL — AB (ref 0.70–1.18)
Calcium: 10.1 mg/dL (ref 8.6–10.3)
Chloride: 107 mmol/L (ref 98–110)
GFR, Est African American: 60 mL/min/{1.73_m2} (ref >=60)
GFR, Est Non African American: 52 mL/min/{1.73_m2} — ABNORMAL LOW (ref >=60)
Globulin: 2.7 g/dL (calc) (ref 1.9–3.7)
Glucose, Bld: 86 mg/dL (ref 65–99)
Potassium: 4.1 mmol/L (ref 3.5–5.3)
Sodium: 143 mmol/L (ref 135–146)
Total Bilirubin: 0.4 mg/dL (ref 0.2–1.2)
Total Protein: 7.4 g/dL (ref 6.1–8.1)

## 2019-01-02 LAB — URINALYSIS, ROUTINE W REFLEX MICROSCOPIC
BILIRUBIN URINE: NEGATIVE
GLUCOSE, UA: NEGATIVE
Hgb urine dipstick: NEGATIVE
KETONES UR: NEGATIVE
Leukocytes,Ua: NEGATIVE
Nitrite: NEGATIVE
PH: 6.5 (ref 5.0–8.0)
Protein, ur: NEGATIVE
Specific Gravity, Urine: 1.022 (ref 1.001–1.03)

## 2019-01-02 LAB — MICROALBUMIN / CREATININE URINE RATIO
Creatinine, Urine: 164 mg/dL (ref 20–320)
Microalb Creat Ratio: 7 mcg/mg creat (ref <30)
Microalb, Ur: 1.2 mg/dL

## 2019-01-02 LAB — VITAMIN D 25 HYDROXY (VIT D DEFICIENCY, FRACTURES): Vit D, 25-Hydroxy: 59 ng/mL (ref 30–100)

## 2019-01-02 LAB — HEMOGLOBIN A1C
Hgb A1c MFr Bld: 5.8 % of total Hgb — ABNORMAL HIGH (ref <5.7)
Mean Plasma Glucose: 120 (calc)
eAG (mmol/L): 6.6 (calc)

## 2019-01-02 LAB — TSH: TSH: 3.59 mIU/L (ref 0.40–4.50)

## 2019-01-02 LAB — INSULIN, RANDOM: Insulin: 14.1 u[IU]/mL (ref 2.0–19.6)

## 2019-01-02 LAB — PSA: PSA: 7 ng/mL — ABNORMAL HIGH (ref <=4.0)

## 2019-01-02 LAB — MAGNESIUM: Magnesium: 2.1 mg/dL (ref 1.5–2.5)

## 2019-03-25 ENCOUNTER — Other Ambulatory Visit: Payer: Self-pay | Admitting: Internal Medicine

## 2019-08-08 ENCOUNTER — Other Ambulatory Visit: Payer: Self-pay | Admitting: Internal Medicine

## 2019-09-07 ENCOUNTER — Other Ambulatory Visit: Payer: Self-pay | Admitting: Internal Medicine

## 2019-09-21 ENCOUNTER — Other Ambulatory Visit: Payer: Self-pay | Admitting: Physician Assistant

## 2019-09-21 ENCOUNTER — Other Ambulatory Visit: Payer: Self-pay | Admitting: Internal Medicine
